# Patient Record
Sex: Female | Born: 1975 | Race: White | Hispanic: No | Marital: Married
Health system: Southern US, Community
[De-identification: ages and names within clinical notes are randomized; demographics above are authoritative.]

## PROBLEM LIST (undated history)

## (undated) DIAGNOSIS — G44229 Chronic tension-type headache, not intractable: Secondary | ICD-10-CM

## (undated) DIAGNOSIS — E782 Mixed hyperlipidemia: Secondary | ICD-10-CM

## (undated) DIAGNOSIS — N857 Hematometra: Secondary | ICD-10-CM

## (undated) DIAGNOSIS — Z8759 Personal history of other complications of pregnancy, childbirth and the puerperium: Secondary | ICD-10-CM

## (undated) DIAGNOSIS — G8929 Other chronic pain: Secondary | ICD-10-CM

## (undated) DIAGNOSIS — K219 Gastro-esophageal reflux disease without esophagitis: Secondary | ICD-10-CM

## (undated) DIAGNOSIS — Z789 Other specified health status: Secondary | ICD-10-CM

## (undated) DIAGNOSIS — Z803 Family history of malignant neoplasm of breast: Secondary | ICD-10-CM

## (undated) DIAGNOSIS — N200 Calculus of kidney: Secondary | ICD-10-CM

## (undated) DIAGNOSIS — Z87442 Personal history of urinary calculi: Secondary | ICD-10-CM

## (undated) DIAGNOSIS — F411 Generalized anxiety disorder: Secondary | ICD-10-CM

## (undated) DIAGNOSIS — Z8041 Family history of malignant neoplasm of ovary: Secondary | ICD-10-CM

## (undated) DIAGNOSIS — F410 Panic disorder [episodic paroxysmal anxiety] without agoraphobia: Secondary | ICD-10-CM

## (undated) HISTORY — PX: WISDOM TOOTH EXTRACTION: SHX21

## (undated) HISTORY — DX: Family history of malignant neoplasm of ovary: Z80.41

## (undated) HISTORY — DX: Family history of malignant neoplasm of breast: Z80.3

---
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
## 1998-06-03 HISTORY — PX: PILONIDAL CYST EXCISION: SHX744

## 1999-09-24 ENCOUNTER — Other Ambulatory Visit: Admission: RE | Admit: 1999-09-24 | Discharge: 1999-09-24 | Payer: Self-pay | Admitting: General Practice

## 2002-06-03 DIAGNOSIS — Z8639 Personal history of other endocrine, nutritional and metabolic disease: Secondary | ICD-10-CM

## 2002-06-03 HISTORY — DX: Personal history of other endocrine, nutritional and metabolic disease: Z86.39

## 2004-02-02 DIAGNOSIS — E890 Postprocedural hypothyroidism: Secondary | ICD-10-CM

## 2004-02-02 HISTORY — DX: Postprocedural hypothyroidism: E89.0

## 2004-02-15 HISTORY — PX: TOTAL THYROIDECTOMY: SHX2547

## 2004-06-03 HISTORY — PX: THYROID SURGERY: SHX805

## 2006-10-16 ENCOUNTER — Encounter: Admission: RE | Admit: 2006-10-16 | Discharge: 2006-10-16 | Payer: Self-pay | Admitting: Internal Medicine

## 2006-10-20 ENCOUNTER — Emergency Department (HOSPITAL_COMMUNITY): Admission: EM | Admit: 2006-10-20 | Discharge: 2006-10-20 | Payer: Self-pay | Admitting: Emergency Medicine

## 2007-02-16 ENCOUNTER — Encounter: Admission: RE | Admit: 2007-02-16 | Discharge: 2007-02-16 | Payer: Self-pay | Admitting: Otolaryngology

## 2007-03-02 ENCOUNTER — Encounter: Admission: RE | Admit: 2007-03-02 | Discharge: 2007-03-02 | Payer: Self-pay | Admitting: Obstetrics & Gynecology

## 2007-10-05 ENCOUNTER — Encounter (INDEPENDENT_AMBULATORY_CARE_PROVIDER_SITE_OTHER): Payer: Self-pay | Admitting: Diagnostic Radiology

## 2007-10-05 ENCOUNTER — Encounter: Admission: RE | Admit: 2007-10-05 | Discharge: 2007-10-05 | Payer: Self-pay | Admitting: Family Medicine

## 2009-02-08 IMAGING — RF DG ESOPHAGUS
10 of 12 series · 20 of 24 positions shown · non-contrast
Comparison: none

CLINICAL DATA: Dysphagia.
 BARIUM SWALLOW:

[Series 1: run · 4 of 7 slices shown (1 of 10)]
[im 1/7]
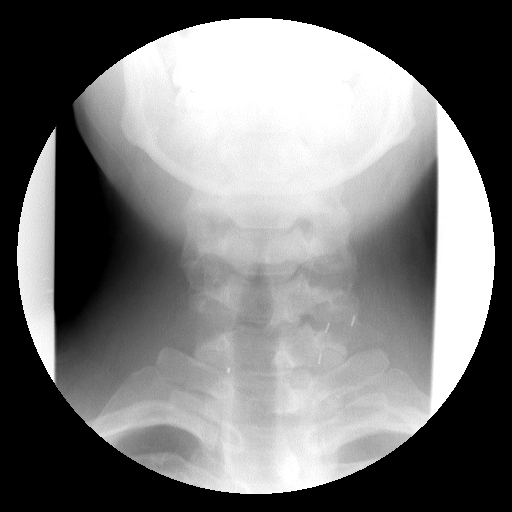
[im 2/7]
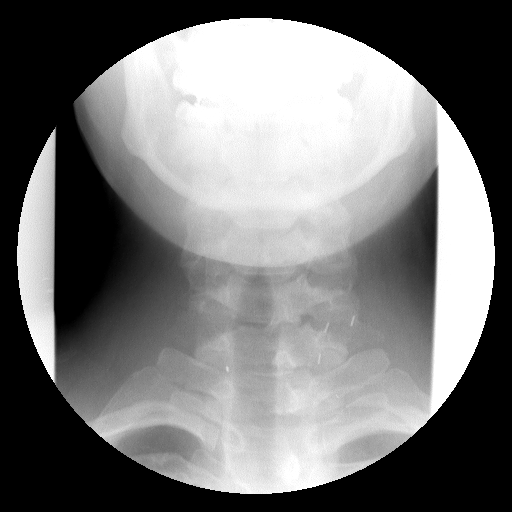
[im 5/7]
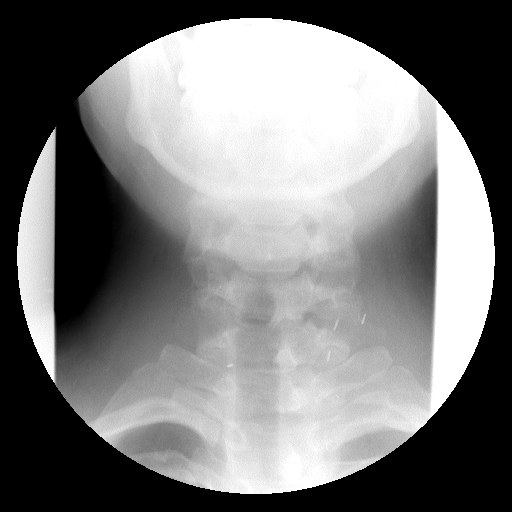
[im 7/7]
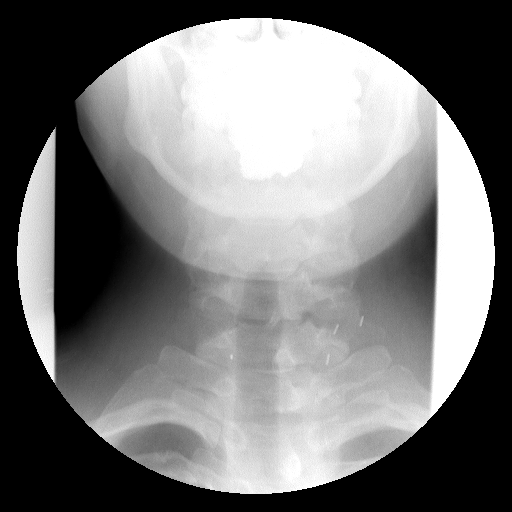

[Series 2: run · 3 of 3 slices shown (2 of 10)]
[im 1/3]
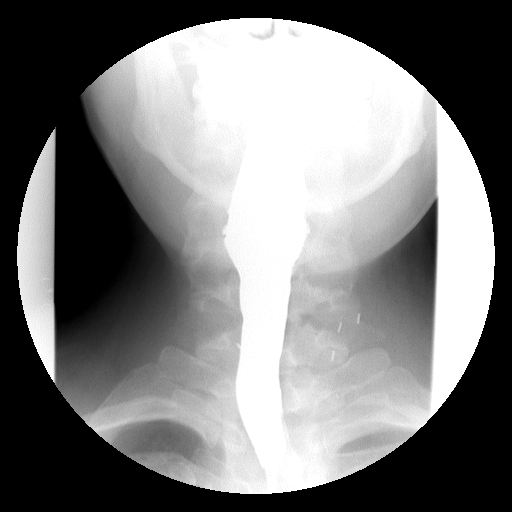
[im 2/3]
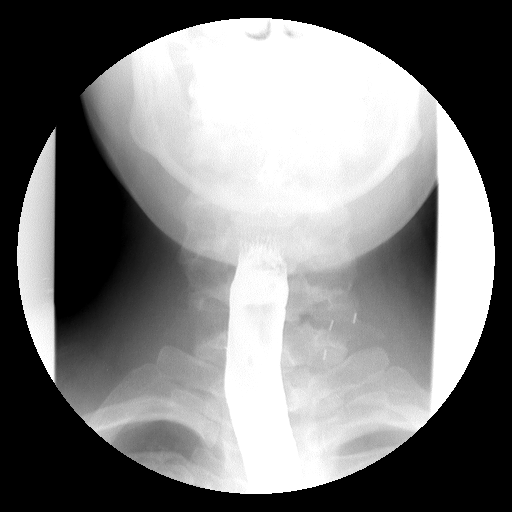
[im 3/3]
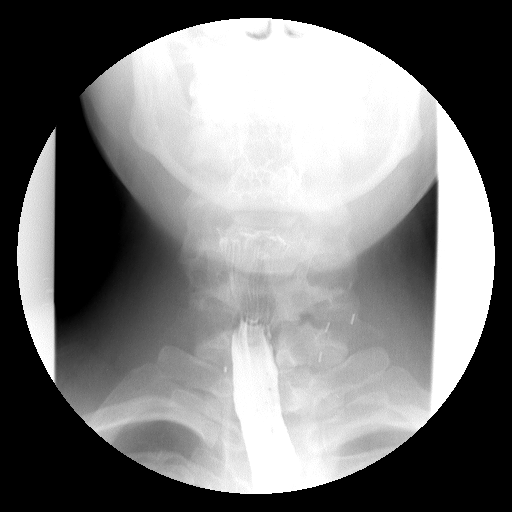

[Series 4: run · 3 of 4 slices shown (3 of 10)]
[im 1/4]
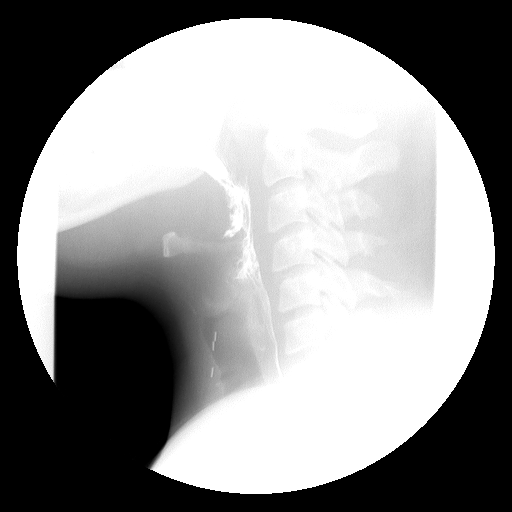
[im 2/4]
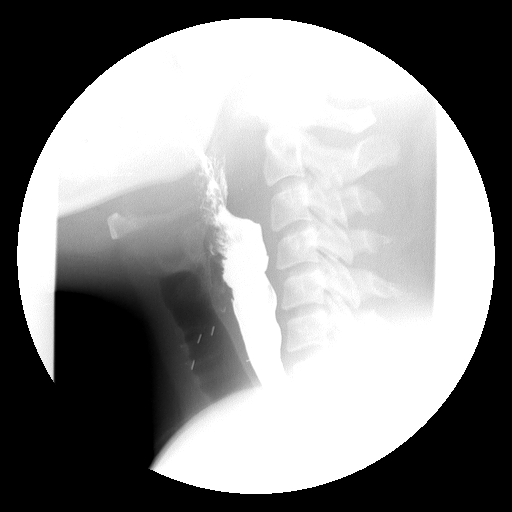
[im 4/4]
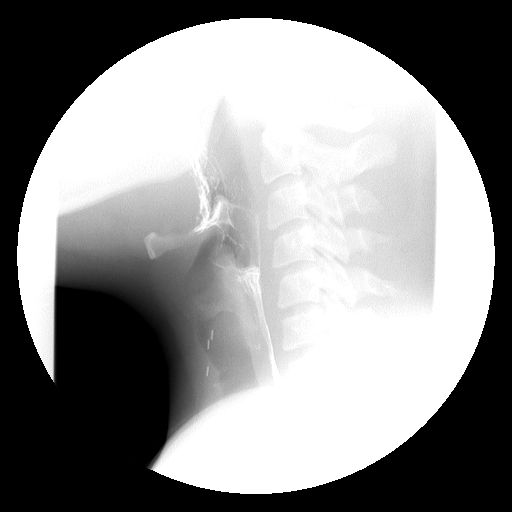

[Series 5: run · 1 of 1 slices shown (4 of 10)]
[im 1/1]
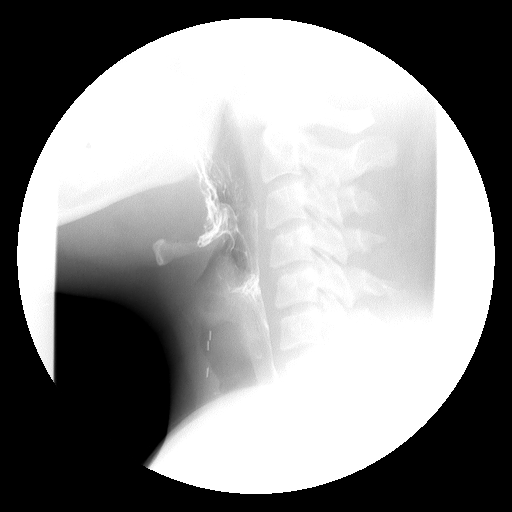

[Series 6: run · 4 of 6 slices shown (5 of 10)]
[im 1/6]
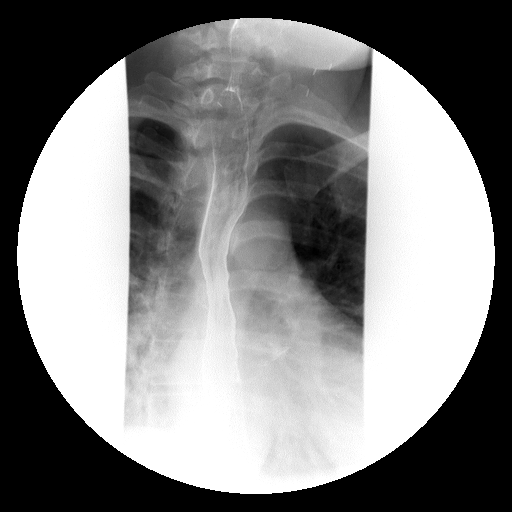
[im 3/6]
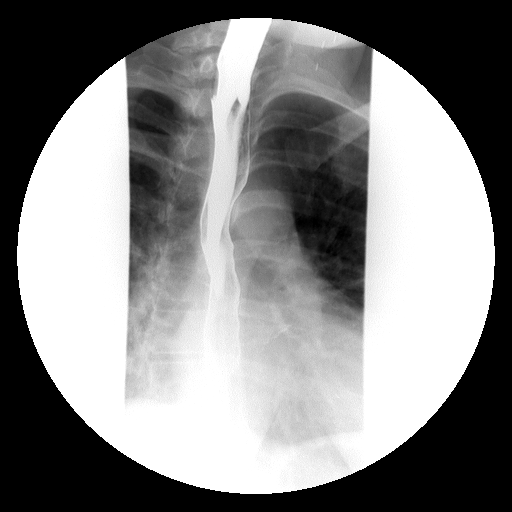
[im 4/6]
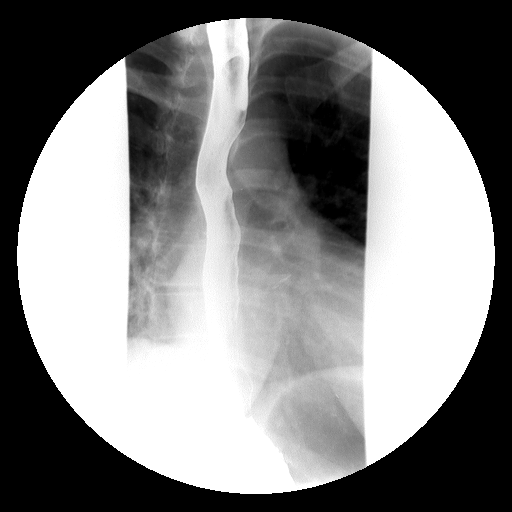
[im 6/6]
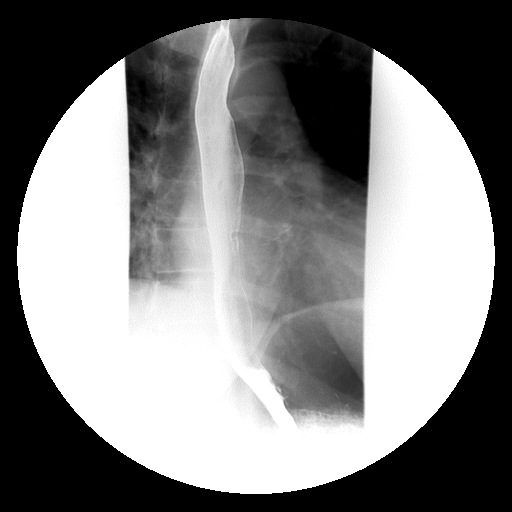

[Series 7: run · 1 of 1 slices shown (6 of 10)]
[im 1/1]
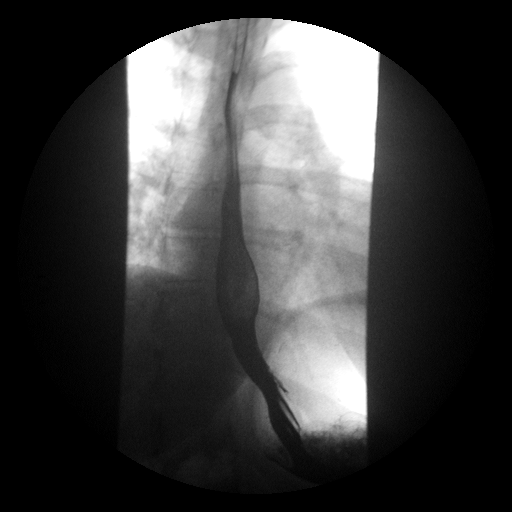

[Series 8: run · 1 of 1 slices shown (7 of 10)]
[im 1/1]
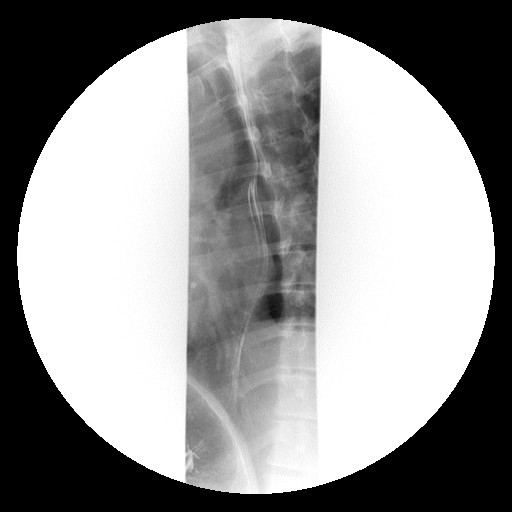

[Series 10: run · 1 of 1 slices shown (8 of 10)]
[im 1/1]
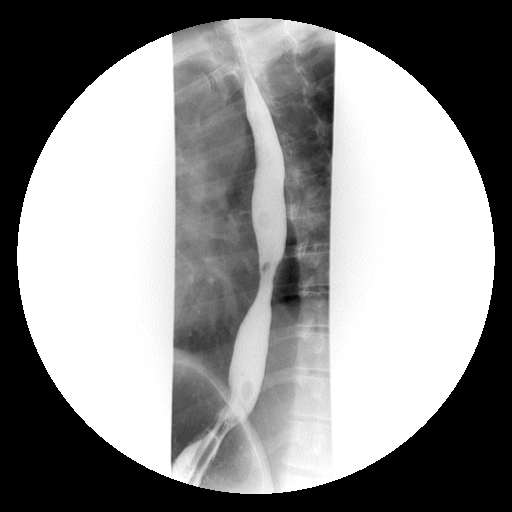

[Series 11: run · 1 of 1 slices shown (9 of 10)]
[im 1/1]
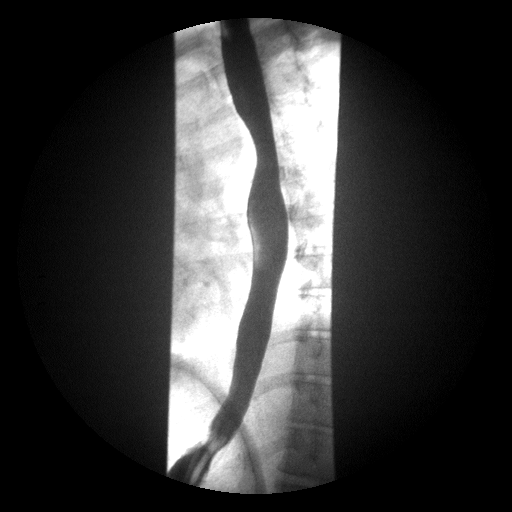

[Series 12: run · 1 of 1 slices shown (10 of 10)]
[im 1/1]
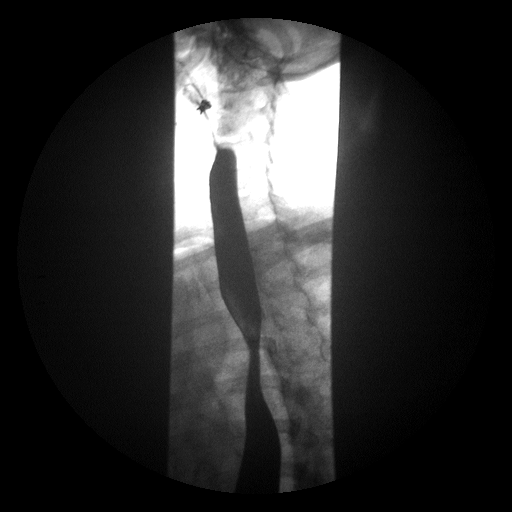

[20 of 24 positions shown; findings below may reference images not displayed]

FINDINGS: Swallowing mechanism unremarkable.  Esophageal motility normal.  No esophageal fold thickening, stricture or obstruction.  13 mm barium pill passed into the stomach without difficulty.
IMPRESSION: No acute findings.

## 2010-06-24 ENCOUNTER — Encounter: Payer: Self-pay | Admitting: Otolaryngology

## 2012-07-23 LAB — OB RESULTS CONSOLE HIV ANTIBODY (ROUTINE TESTING): HIV: NONREACTIVE

## 2012-07-23 LAB — OB RESULTS CONSOLE ABO/RH

## 2012-07-27 LAB — OB RESULTS CONSOLE GC/CHLAMYDIA: Gonorrhea: NEGATIVE

## 2012-12-30 LAB — OB RESULTS CONSOLE GBS: GBS: NEGATIVE

## 2013-01-19 ENCOUNTER — Encounter (HOSPITAL_COMMUNITY): Payer: Self-pay | Admitting: *Deleted

## 2013-01-19 ENCOUNTER — Inpatient Hospital Stay (HOSPITAL_COMMUNITY)
Admission: AD | Admit: 2013-01-19 | Discharge: 2013-01-23 | DRG: 765 | Disposition: A | Discharge status: Home / Self Care | Payer: Medicaid Other | Source: Ambulatory Visit | Attending: Obstetrics & Gynecology | Admitting: Obstetrics & Gynecology

## 2013-01-19 DIAGNOSIS — O139 Gestational [pregnancy-induced] hypertension without significant proteinuria, unspecified trimester: Principal | ICD-10-CM | POA: Diagnosis present

## 2013-01-19 DIAGNOSIS — Z98891 History of uterine scar from previous surgery: Secondary | ICD-10-CM

## 2013-01-19 DIAGNOSIS — O09529 Supervision of elderly multigravida, unspecified trimester: Secondary | ICD-10-CM | POA: Diagnosis present

## 2013-01-19 DIAGNOSIS — O411090 Infection of amniotic sac and membranes, unspecified, unspecified trimester, not applicable or unspecified: Secondary | ICD-10-CM | POA: Diagnosis present

## 2013-01-19 HISTORY — DX: Panic disorder (episodic paroxysmal anxiety): F41.0

## 2013-01-19 LAB — URINALYSIS, ROUTINE W REFLEX MICROSCOPIC
Bilirubin Urine: NEGATIVE
Ketones, ur: NEGATIVE mg/dL
Nitrite: NEGATIVE
Protein, ur: NEGATIVE mg/dL
Urobilinogen, UA: 0.2 mg/dL (ref 0.0–1.0)

## 2013-01-19 LAB — COMPREHENSIVE METABOLIC PANEL
AST: 19 U/L (ref 0–37)
Albumin: 2.8 g/dL — ABNORMAL LOW (ref 3.5–5.2)
CO2: 19 mEq/L (ref 19–32)
Calcium: 9.3 mg/dL (ref 8.4–10.5)
Creatinine, Ser: 0.65 mg/dL (ref 0.50–1.10)
GFR calc non Af Amer: >90 mL/min (ref >90)

## 2013-01-19 LAB — URINE MICROSCOPIC-ADD ON

## 2013-01-19 LAB — TYPE AND SCREEN: ABO/RH(D): A POS

## 2013-01-19 LAB — CBC
MCH: 31.5 pg (ref 26.0–34.0)
MCV: 92.4 fL (ref 78.0–100.0)
Platelets: 300 10*3/uL (ref 150–400)
RDW: 14.2 % (ref 11.5–15.5)

## 2013-01-19 MED ORDER — ZOLPIDEM TARTRATE 5 MG PO TABS
5.0000 mg | ORAL_TABLET | Freq: Every evening | ORAL | Status: DC | PRN
  Administered 2013-01-19: 5 mg via ORAL
  Filled 2013-01-19: qty 1

## 2013-01-19 MED ORDER — TERBUTALINE SULFATE 1 MG/ML IJ SOLN: 0.2500 mg | Freq: Once | INTRAMUSCULAR | Status: AC | PRN

## 2013-01-19 MED ORDER — OXYTOCIN 40 UNITS IN LACTATED RINGERS INFUSION - SIMPLE MED: 62.5000 mL/h | INTRAVENOUS | Status: DC

## 2013-01-19 MED ORDER — CITRIC ACID-SODIUM CITRATE 334-500 MG/5ML PO SOLN: 30.0000 mL | ORAL | Status: DC | PRN

## 2013-01-19 MED ORDER — ONDANSETRON HCL 4 MG/2ML IJ SOLN
4.0000 mg | Freq: Four times a day (QID) | INTRAMUSCULAR | Status: DC | PRN
  Administered 2013-01-21: 4 mg via INTRAVENOUS
  Filled 2013-01-19: qty 2

## 2013-01-19 MED ORDER — LIDOCAINE HCL (PF) 1 % IJ SOLN: 30.0000 mL | INTRAMUSCULAR | Status: DC | PRN

## 2013-01-19 MED ORDER — LACTATED RINGERS IV SOLN
INTRAVENOUS | Status: DC
  Administered 2013-01-19 – 2013-01-20 (×2): via INTRAVENOUS
  Administered 2013-01-20: 1000 mL via INTRAVENOUS
  Administered 2013-01-21 (×2): via INTRAVENOUS

## 2013-01-19 MED ORDER — ACETAMINOPHEN 325 MG PO TABS
650.0000 mg | ORAL_TABLET | ORAL | Status: DC | PRN
  Administered 2013-01-21: 650 mg via ORAL
  Filled 2013-01-19: qty 2

## 2013-01-19 MED ORDER — OXYCODONE-ACETAMINOPHEN 5-325 MG PO TABS: 1.0000 | ORAL_TABLET | ORAL | Status: DC | PRN

## 2013-01-19 MED ORDER — OXYTOCIN BOLUS FROM INFUSION: 500.0000 mL | INTRAVENOUS | Status: DC

## 2013-01-19 MED ORDER — LACTATED RINGERS IV SOLN
500.0000 mL | INTRAVENOUS | Status: DC | PRN
  Administered 2013-01-20: 500 mL via INTRAVENOUS

## 2013-01-19 MED ORDER — MISOPROSTOL 25 MCG QUARTER TABLET
25.0000 ug | ORAL_TABLET | ORAL | Status: DC | PRN
  Administered 2013-01-19 – 2013-01-20 (×3): 25 ug via VAGINAL
  Filled 2013-01-19 (×2): qty 0.25

## 2013-01-19 MED ORDER — FLEET ENEMA 7-19 GM/118ML RE ENEM: 1.0000 | ENEMA | RECTAL | Status: DC | PRN

## 2013-01-19 MED ORDER — IBUPROFEN 600 MG PO TABS: 600.0000 mg | ORAL_TABLET | Freq: Four times a day (QID) | ORAL | Status: DC | PRN

## 2013-01-19 NOTE — MAU Note (Signed)
Dr. Langston Masker notified of patient arrival to [facility]. Patient is to be an IOL for gestational hypertension. Orders received to start evaluation process in MAU for later admission to birthing suites when bed becoming available.

## 2013-01-19 NOTE — MAU Note (Addendum)
Patient states was sent to MAU from Dr. Langston Masker office for an induction. Denies LOF, bleeding, nor contractions. States has had blood pressure problems during this pregnancy. Last checked yesterday in office and was closed.

## 2013-01-20 ENCOUNTER — Inpatient Hospital Stay (HOSPITAL_COMMUNITY): Payer: Medicaid Other | Admitting: Anesthesiology

## 2013-01-20 ENCOUNTER — Encounter (HOSPITAL_COMMUNITY): Payer: Self-pay | Admitting: Anesthesiology

## 2013-01-20 LAB — CBC
Hemoglobin: 11.2 g/dL — ABNORMAL LOW (ref 12.0–15.0)
MCH: 31.5 pg (ref 26.0–34.0)
MCHC: 34 g/dL (ref 30.0–36.0)
MCV: 92.7 fL (ref 78.0–100.0)
RBC: 3.55 MIL/uL — ABNORMAL LOW (ref 3.87–5.11)

## 2013-01-20 MED ORDER — PHENYLEPHRINE 40 MCG/ML (10ML) SYRINGE FOR IV PUSH (FOR BLOOD PRESSURE SUPPORT)
80.0000 ug | PREFILLED_SYRINGE | INTRAVENOUS | Status: DC | PRN
Start: 2013-01-20 — End: 2013-01-21

## 2013-01-20 MED ORDER — DIPHENHYDRAMINE HCL 50 MG/ML IJ SOLN: 12.5000 mg | INTRAMUSCULAR | Status: DC | PRN

## 2013-01-20 MED ORDER — TERBUTALINE SULFATE 1 MG/ML IJ SOLN: 0.2500 mg | Freq: Once | INTRAMUSCULAR | Status: AC | PRN

## 2013-01-20 MED ORDER — EPHEDRINE 5 MG/ML INJ: 10.0000 mg | INTRAVENOUS | Status: DC | PRN

## 2013-01-20 MED ORDER — LACTATED RINGERS IV SOLN: 500.0000 mL | Freq: Once | INTRAVENOUS | Status: DC

## 2013-01-20 MED ORDER — BUTORPHANOL TARTRATE 1 MG/ML IJ SOLN
1.0000 mg | Freq: Once | INTRAMUSCULAR | Status: AC
  Administered 2013-01-20: 1 mg via INTRAVENOUS

## 2013-01-20 MED ORDER — SODIUM BICARBONATE 8.4 % IV SOLN
INTRAVENOUS | Status: DC | PRN
  Administered 2013-01-20: 5 mL via EPIDURAL
  Administered 2013-01-21: 3 mL via EPIDURAL
  Administered 2013-01-21 (×2): 5 mL via EPIDURAL
  Administered 2013-01-21: 7 mL via EPIDURAL

## 2013-01-20 MED ORDER — OXYTOCIN 40 UNITS IN LACTATED RINGERS INFUSION - SIMPLE MED
1.0000 m[IU]/min | INTRAVENOUS | Status: DC
  Administered 2013-01-20: 4 m[IU]/min via INTRAVENOUS
  Administered 2013-01-20: 2 m[IU]/min via INTRAVENOUS
  Administered 2013-01-20: 10 m[IU]/min via INTRAVENOUS
  Administered 2013-01-20: 8 m[IU]/min via INTRAVENOUS
  Administered 2013-01-20: 6 m[IU]/min via INTRAVENOUS
  Filled 2013-01-20: qty 1000

## 2013-01-20 MED ORDER — FENTANYL 2.5 MCG/ML BUPIVACAINE 1/10 % EPIDURAL INFUSION (WH - ANES)
14.0000 mL/h | INTRAMUSCULAR | Status: DC | PRN
  Administered 2013-01-20 – 2013-01-21 (×3): 14 mL/h via EPIDURAL
  Filled 2013-01-20 (×4): qty 125

## 2013-01-20 MED ORDER — EPHEDRINE 5 MG/ML INJ
10.0000 mg | INTRAVENOUS | Status: DC | PRN
  Filled 2013-01-20: qty 4

## 2013-01-20 MED ORDER — PHENYLEPHRINE 40 MCG/ML (10ML) SYRINGE FOR IV PUSH (FOR BLOOD PRESSURE SUPPORT)
80.0000 ug | PREFILLED_SYRINGE | INTRAVENOUS | Status: DC | PRN
  Filled 2013-01-20: qty 5

## 2013-01-20 MED ORDER — BUTORPHANOL TARTRATE 1 MG/ML IJ SOLN
1.0000 mg | INTRAMUSCULAR | Status: DC | PRN
  Administered 2013-01-20: 1 mg via INTRAVENOUS
  Filled 2013-01-20 (×2): qty 1

## 2013-01-20 NOTE — Anesthesia Procedure Notes (Addendum)

## 2013-01-20 NOTE — Anesthesia Preprocedure Evaluation (Addendum)
Anesthesia Evaluation  Patient identified by MRN, date of birth, ID band Patient awake    Reviewed: Allergy & Precautions, H&P , Patient's Chart, lab work & pertinent test results  Airway Mallampati: II  TM Distance: >3 FB Neck ROM: full    Dental  (+) Teeth Intact   Pulmonary    breath sounds clear to auscultation       Cardiovascular hypertension,  Rhythm:regular Rate:Normal     Neuro/Psych    GI/Hepatic   Endo/Other  Morbid obesity  Renal/GU      Musculoskeletal   Abdominal   Peds  Hematology   Anesthesia Other Findings       Reproductive/Obstetrics (+) Pregnancy                             Anesthesia Physical Anesthesia Plan  ASA: III  Anesthesia Plan: Epidural   Post-op Pain Management:    Induction:   Airway Management Planned:   Additional Equipment:   Intra-op Plan:   Post-operative Plan:   Informed Consent: I have reviewed the patients History and Physical, chart, labs and discussed the procedure including the risks, benefits and alternatives for the proposed anesthesia with the patient or authorized representative who has indicated his/her understanding and acceptance.   Dental Advisory Given  Plan Discussed with:   Anesthesia Plan Comments: (Labs checked- platelets confirmed with RN in room. Fetal heart tracing, per RN, reported to be stable enough for sitting procedure. Discussed epidural, and patient consents to the procedure:  included risk of possible headache,backache, failed block, allergic reaction, and nerve injury. This patient was asked if she had any questions or concerns before the procedure started.)        Anesthesia Quick Evaluation  

## 2013-01-20 NOTE — Progress Notes (Signed)
Pt feeling mild ctx.  No HA, N/v or visual changes.  No vb or lof.  Good fm  FHT reassuring Toco Q1-2 Cvx softer 1/50/-2, ballotable  Will re-examine in a few hours and attempt foley bulb Exp mngt Continue pitocin

## 2013-01-20 NOTE — Progress Notes (Signed)
Pt feeling mild contractions.  cytotec x 3 given o/n.  FHT reassuring Toco Q3-4 Cvx tight FT  A/P:  Too many ctx to continue cytotec.  Will start low dose pitocin If Cvx more dilated at next exam, may try foley bulb.  (h/o cryo)

## 2013-01-20 NOTE — H&P (Signed)
Terri Lopez is a 37 y.o. female presenting for IOL.  Pt with gestational HTN - BP in office 140-150/90s.  No sx of pre-eclampsia. History OB History   Grav Para Term Preterm Abortions TAB SAB Ect Mult Living   2    1 1          Past Medical History  Diagnosis Date  . Panic attacks    Past Surgical History  Procedure Laterality Date  . Thyroid surgery  2006   Family History: family history includes Diabetes in her mother. Social History:  reports that she quit smoking about 5 months ago. Her smoking use included Cigarettes. She smoked 1.00 pack per day. She has never used smokeless tobacco. She reports that she does not drink alcohol or use illicit drugs.   Prenatal Transfer Tool  Maternal Diabetes: No Genetic Screening: Normal Maternal Ultrasounds/Referrals: Normal Fetal Ultrasounds or other Referrals:  None Maternal Substance Abuse:  No Significant Maternal Medications:  Meds include: Zoloft Syntroid Significant Maternal Lab Results:  None Other Comments:  None  ROS  Dilation: Fingertip Effacement (%): 50 Station: -2 Exam by:: Jorgen Wolfinger Blood pressure 149/87, pulse 79, temperature 98.5 F (36.9 C), temperature source Oral, resp. rate 20, height 5\' 2"  (1.575 m), weight 106.142 kg (234 lb), SpO2 99.00%. Exam Physical Exam  Gen - NAD Abd - gravid, NT.  EFW 8 1/2# Ext - NT, trace edema bilat Cvx - FT Prenatal labs: ABO, Rh: --/--/A POS (08/19 2130) Antibody: NEG (08/19 2130) Rubella: Immune (02/20 0000) RPR: NON REACTIVE (08/19 1920)  HBsAg: Negative (02/20 0000)  HIV: Non-reactive (02/20 0000)  GBS: Negative (07/30 0000)   Assessment/Plan: Admit IOL   Jonah Gingras 01/20/2013, 9:14 AM

## 2013-01-20 NOTE — Progress Notes (Signed)
Pt reports occ discomfort.  No regular contractions.  No vb or lof  FHT reassuring Toco Q1-2 Cvx 1/50/-2 Foley bulb inserted  A/P:  Continue pitocin Exp mngt Epidural/IV pain meds prn

## 2013-01-21 ENCOUNTER — Encounter (HOSPITAL_COMMUNITY): Payer: Self-pay | Admitting: *Deleted

## 2013-01-21 ENCOUNTER — Encounter (HOSPITAL_COMMUNITY)
Admission: AD | Disposition: A | Discharge status: Home / Self Care | Payer: Self-pay | Source: Ambulatory Visit | Attending: Obstetrics & Gynecology

## 2013-01-21 LAB — CBC
HCT: 31.4 % — ABNORMAL LOW (ref 36.0–46.0)
MCHC: 34.4 g/dL (ref 30.0–36.0)
MCV: 92.6 fL (ref 78.0–100.0)
RDW: 14.4 % (ref 11.5–15.5)

## 2013-01-21 SURGERY — Surgical Case
Anesthesia: Epidural | Site: Abdomen | Wound class: Clean Contaminated

## 2013-01-21 MED ORDER — LACTATED RINGERS IV SOLN
INTRAVENOUS | Status: DC | PRN
  Administered 2013-01-21 (×3): via INTRAVENOUS

## 2013-01-21 MED ORDER — DIBUCAINE 1 % RE OINT: 1.0000 "application " | TOPICAL_OINTMENT | RECTAL | Status: DC | PRN

## 2013-01-21 MED ORDER — LIDOCAINE-EPINEPHRINE (PF) 2 %-1:200000 IJ SOLN
INTRAMUSCULAR | Status: AC
  Filled 2013-01-21: qty 20

## 2013-01-21 MED ORDER — KETOROLAC TROMETHAMINE 30 MG/ML IJ SOLN: 30.0000 mg | Freq: Four times a day (QID) | INTRAMUSCULAR | Status: AC | PRN

## 2013-01-21 MED ORDER — OXYTOCIN 40 UNITS IN LACTATED RINGERS INFUSION - SIMPLE MED: 62.5000 mL/h | INTRAVENOUS | Status: AC

## 2013-01-21 MED ORDER — MENTHOL 3 MG MT LOZG: 1.0000 | LOZENGE | OROMUCOSAL | Status: DC | PRN

## 2013-01-21 MED ORDER — DIPHENHYDRAMINE HCL 25 MG PO CAPS
25.0000 mg | ORAL_CAPSULE | ORAL | Status: DC | PRN
  Filled 2013-01-21: qty 1

## 2013-01-21 MED ORDER — DEXTROSE 5 % IV SOLN
1.0000 g | Freq: Two times a day (BID) | INTRAVENOUS | Status: AC
  Administered 2013-01-22 – 2013-01-23 (×3): 1 g via INTRAVENOUS
  Filled 2013-01-21 (×3): qty 1

## 2013-01-21 MED ORDER — METOCLOPRAMIDE HCL 5 MG/ML IJ SOLN: 10.0000 mg | Freq: Three times a day (TID) | INTRAMUSCULAR | Status: DC | PRN

## 2013-01-21 MED ORDER — DIPHENHYDRAMINE HCL 50 MG/ML IJ SOLN: 12.5000 mg | INTRAMUSCULAR | Status: DC | PRN

## 2013-01-21 MED ORDER — DEXTROSE 5 % IV SOLN
2.0000 g | INTRAVENOUS | Status: DC
  Filled 2013-01-21: qty 2

## 2013-01-21 MED ORDER — CITRIC ACID-SODIUM CITRATE 334-500 MG/5ML PO SOLN
ORAL | Status: AC
  Administered 2013-01-21: 30 mL
  Filled 2013-01-21: qty 15

## 2013-01-21 MED ORDER — ONDANSETRON HCL 4 MG PO TABS: 4.0000 mg | ORAL_TABLET | ORAL | Status: DC | PRN

## 2013-01-21 MED ORDER — LACTATED RINGERS IV SOLN
INTRAVENOUS | Status: DC | PRN
  Administered 2013-01-21: 19:00:00 via INTRAVENOUS

## 2013-01-21 MED ORDER — NALBUPHINE HCL 10 MG/ML IJ SOLN: 5.0000 mg | INTRAMUSCULAR | Status: DC | PRN

## 2013-01-21 MED ORDER — ONDANSETRON HCL 4 MG/2ML IJ SOLN
INTRAMUSCULAR | Status: DC | PRN
  Administered 2013-01-21: 4 mg via INTRAVENOUS

## 2013-01-21 MED ORDER — EPHEDRINE 5 MG/ML INJ
INTRAVENOUS | Status: AC
  Filled 2013-01-21: qty 10

## 2013-01-21 MED ORDER — PROMETHAZINE HCL 25 MG/ML IJ SOLN: 6.2500 mg | INTRAMUSCULAR | Status: DC | PRN

## 2013-01-21 MED ORDER — SENNOSIDES-DOCUSATE SODIUM 8.6-50 MG PO TABS
2.0000 | ORAL_TABLET | Freq: Every day | ORAL | Status: DC
  Administered 2013-01-22: 2 via ORAL

## 2013-01-21 MED ORDER — SCOPOLAMINE 1 MG/3DAYS TD PT72
1.0000 | MEDICATED_PATCH | Freq: Once | TRANSDERMAL | Status: DC
  Administered 2013-01-21: 1.5 mg via TRANSDERMAL

## 2013-01-21 MED ORDER — NALOXONE HCL 1 MG/ML IJ SOLN: 1.0000 ug/kg/h | INTRAMUSCULAR | Status: DC | PRN

## 2013-01-21 MED ORDER — KETOROLAC TROMETHAMINE 60 MG/2ML IM SOLN
INTRAMUSCULAR | Status: AC
  Filled 2013-01-21: qty 2

## 2013-01-21 MED ORDER — MEPERIDINE HCL 25 MG/ML IJ SOLN: 6.2500 mg | INTRAMUSCULAR | Status: DC | PRN

## 2013-01-21 MED ORDER — SODIUM BICARBONATE 8.4 % IV SOLN
INTRAVENOUS | Status: AC
  Filled 2013-01-21: qty 50

## 2013-01-21 MED ORDER — ONDANSETRON HCL 4 MG/2ML IJ SOLN: 4.0000 mg | Freq: Three times a day (TID) | INTRAMUSCULAR | Status: DC | PRN

## 2013-01-21 MED ORDER — DIPHENHYDRAMINE HCL 50 MG/ML IJ SOLN: 25.0000 mg | INTRAMUSCULAR | Status: DC | PRN

## 2013-01-21 MED ORDER — KETOROLAC TROMETHAMINE 30 MG/ML IJ SOLN: 15.0000 mg | Freq: Once | INTRAMUSCULAR | Status: DC | PRN

## 2013-01-21 MED ORDER — MORPHINE SULFATE (PF) 0.5 MG/ML IJ SOLN
INTRAMUSCULAR | Status: DC | PRN
  Administered 2013-01-21: 1 mg via INTRAVENOUS
  Administered 2013-01-21: 4 mg via EPIDURAL

## 2013-01-21 MED ORDER — LACTATED RINGERS IV SOLN
INTRAVENOUS | Status: DC
  Administered 2013-01-21 – 2013-01-22 (×2): via INTRAVENOUS

## 2013-01-21 MED ORDER — KETOROLAC TROMETHAMINE 60 MG/2ML IM SOLN
60.0000 mg | Freq: Once | INTRAMUSCULAR | Status: AC | PRN
  Administered 2013-01-21: 60 mg via INTRAMUSCULAR

## 2013-01-21 MED ORDER — WITCH HAZEL-GLYCERIN EX PADS
1.0000 "application " | MEDICATED_PAD | CUTANEOUS | Status: DC | PRN
  Administered 2013-01-22: 1 via TOPICAL

## 2013-01-21 MED ORDER — SIMETHICONE 80 MG PO CHEW
80.0000 mg | CHEWABLE_TABLET | Freq: Three times a day (TID) | ORAL | Status: DC
  Administered 2013-01-22 – 2013-01-23 (×6): 80 mg via ORAL

## 2013-01-21 MED ORDER — NALOXONE HCL 0.4 MG/ML IJ SOLN: 0.4000 mg | INTRAMUSCULAR | Status: DC | PRN

## 2013-01-21 MED ORDER — ZOLPIDEM TARTRATE 5 MG PO TABS: 5.0000 mg | ORAL_TABLET | Freq: Every evening | ORAL | Status: DC | PRN

## 2013-01-21 MED ORDER — GENTAMICIN SULFATE 40 MG/ML IJ SOLN
150.0000 mg | Freq: Three times a day (TID) | INTRAVENOUS | Status: DC
  Administered 2013-01-21: 150 mg via INTRAVENOUS
  Filled 2013-01-21 (×2): qty 3.75

## 2013-01-21 MED ORDER — DEXTROSE 5 % IV SOLN
170.0000 mg | Freq: Once | INTRAVENOUS | Status: AC
  Administered 2013-01-21: 170 mg via INTRAVENOUS
  Filled 2013-01-21: qty 4.25

## 2013-01-21 MED ORDER — IBUPROFEN 600 MG PO TABS
600.0000 mg | ORAL_TABLET | Freq: Four times a day (QID) | ORAL | Status: DC
  Administered 2013-01-22 – 2013-01-23 (×6): 600 mg via ORAL
  Filled 2013-01-21 (×6): qty 1

## 2013-01-21 MED ORDER — OXYCODONE-ACETAMINOPHEN 5-325 MG PO TABS
1.0000 | ORAL_TABLET | ORAL | Status: DC | PRN
  Administered 2013-01-22: 2 via ORAL
  Administered 2013-01-22 – 2013-01-23 (×5): 1 via ORAL
  Administered 2013-01-23: 2 via ORAL
  Administered 2013-01-23: 1 via ORAL
  Filled 2013-01-21: qty 1
  Filled 2013-01-21: qty 2
  Filled 2013-01-21 (×4): qty 1
  Filled 2013-01-21: qty 2
  Filled 2013-01-21: qty 1

## 2013-01-21 MED ORDER — LANOLIN HYDROUS EX OINT: 1.0000 "application " | TOPICAL_OINTMENT | CUTANEOUS | Status: DC | PRN

## 2013-01-21 MED ORDER — GLYCOPYRROLATE 0.2 MG/ML IJ SOLN
INTRAMUSCULAR | Status: DC | PRN
  Administered 2013-01-21: 0.1 mg via INTRAVENOUS

## 2013-01-21 MED ORDER — SODIUM CHLORIDE 0.9 % IV SOLN
2.0000 g | Freq: Once | INTRAVENOUS | Status: AC
  Administered 2013-01-21: 2 g via INTRAVENOUS
  Filled 2013-01-21: qty 2000

## 2013-01-21 MED ORDER — MORPHINE SULFATE 0.5 MG/ML IJ SOLN
INTRAMUSCULAR | Status: AC
  Filled 2013-01-21: qty 10

## 2013-01-21 MED ORDER — SODIUM CHLORIDE 0.9 % IJ SOLN: 3.0000 mL | INTRAMUSCULAR | Status: DC | PRN

## 2013-01-21 MED ORDER — HYDROMORPHONE HCL PF 1 MG/ML IJ SOLN
INTRAMUSCULAR | Status: AC
  Filled 2013-01-21: qty 1

## 2013-01-21 MED ORDER — SIMETHICONE 80 MG PO CHEW: 80.0000 mg | CHEWABLE_TABLET | ORAL | Status: DC | PRN

## 2013-01-21 MED ORDER — SODIUM CHLORIDE 0.9 % IV SOLN
1.0000 g | INTRAVENOUS | Status: DC
  Administered 2013-01-21 (×2): 1 g via INTRAVENOUS
  Filled 2013-01-21 (×6): qty 1000

## 2013-01-21 MED ORDER — PHENYLEPHRINE 40 MCG/ML (10ML) SYRINGE FOR IV PUSH (FOR BLOOD PRESSURE SUPPORT)
PREFILLED_SYRINGE | INTRAVENOUS | Status: AC
  Filled 2013-01-21: qty 15

## 2013-01-21 MED ORDER — PHENYLEPHRINE HCL 10 MG/ML IJ SOLN
INTRAMUSCULAR | Status: DC | PRN
  Administered 2013-01-21: 80 ug via INTRAVENOUS

## 2013-01-21 MED ORDER — TETANUS-DIPHTH-ACELL PERTUSSIS 5-2.5-18.5 LF-MCG/0.5 IM SUSP: 0.5000 mL | Freq: Once | INTRAMUSCULAR | Status: DC

## 2013-01-21 MED ORDER — CALCIUM CARBONATE ANTACID 500 MG PO CHEW: 400.0000 mg | CHEWABLE_TABLET | Freq: Three times a day (TID) | ORAL | Status: DC | PRN

## 2013-01-21 MED ORDER — OXYTOCIN 10 UNIT/ML IJ SOLN
INTRAMUSCULAR | Status: AC
  Filled 2013-01-21: qty 4

## 2013-01-21 MED ORDER — LEVOTHYROXINE SODIUM 100 MCG PO TABS
200.0000 ug | ORAL_TABLET | Freq: Every day | ORAL | Status: DC
  Administered 2013-01-22 – 2013-01-23 (×2): 200 ug via ORAL
  Filled 2013-01-21 (×3): qty 2

## 2013-01-21 MED ORDER — DEXTROSE 5 % IV SOLN
2.0000 g | INTRAVENOUS | Status: DC | PRN
  Administered 2013-01-21: 2 g via INTRAVENOUS

## 2013-01-21 MED ORDER — SCOPOLAMINE 1 MG/3DAYS TD PT72
MEDICATED_PATCH | TRANSDERMAL | Status: AC
  Filled 2013-01-21: qty 1

## 2013-01-21 MED ORDER — HYDROMORPHONE HCL PF 1 MG/ML IJ SOLN
0.2500 mg | INTRAMUSCULAR | Status: DC | PRN
  Administered 2013-01-21 (×2): 0.5 mg via INTRAVENOUS

## 2013-01-21 MED ORDER — ONDANSETRON HCL 4 MG/2ML IJ SOLN
4.0000 mg | INTRAMUSCULAR | Status: DC | PRN
  Administered 2013-01-22: 4 mg via INTRAVENOUS
  Filled 2013-01-21: qty 2

## 2013-01-21 MED ORDER — OXYTOCIN 10 UNIT/ML IJ SOLN
40.0000 [IU] | INTRAMUSCULAR | Status: DC | PRN
  Administered 2013-01-21: 40 [IU] via INTRAVENOUS

## 2013-01-21 MED ORDER — ONDANSETRON HCL 4 MG/2ML IJ SOLN
INTRAMUSCULAR | Status: AC
  Filled 2013-01-21: qty 2

## 2013-01-21 MED ORDER — PRENATAL MULTIVITAMIN CH
1.0000 | ORAL_TABLET | Freq: Every day | ORAL | Status: DC
  Administered 2013-01-22 – 2013-01-23 (×2): 1 via ORAL
  Filled 2013-01-21 (×2): qty 1

## 2013-01-21 SURGICAL SUPPLY — 26 items
CLAMP CORD UMBIL (MISCELLANEOUS) IMPLANT
CLOTH BEACON ORANGE TIMEOUT ST (SAFETY) ×2 IMPLANT
DRAPE LG THREE QUARTER DISP (DRAPES) ×2 IMPLANT
DRSG OPSITE POSTOP 4X10 (GAUZE/BANDAGES/DRESSINGS) ×2 IMPLANT
DURAPREP 26ML APPLICATOR (WOUND CARE) ×2 IMPLANT
ELECT REM PT RETURN 9FT ADLT (ELECTROSURGICAL) ×2
ELECTRODE REM PT RTRN 9FT ADLT (ELECTROSURGICAL) ×1 IMPLANT
EXTRACTOR VACUUM M CUP 4 TUBE (SUCTIONS) IMPLANT
GLOVE SURG ORTHO 8.0 STRL STRW (GLOVE) ×2 IMPLANT
GOWN STRL REIN XL XLG (GOWN DISPOSABLE) ×4 IMPLANT
KIT ABG SYR 3ML LUER SLIP (SYRINGE) ×2 IMPLANT
NDL HYPO 25X5/8 SAFETYGLIDE (NEEDLE) ×1 IMPLANT
NEEDLE HYPO 25X5/8 SAFETYGLIDE (NEEDLE) ×2 IMPLANT
NS IRRIG 1000ML POUR BTL (IV SOLUTION) ×2 IMPLANT
PACK C SECTION WH (CUSTOM PROCEDURE TRAY) ×2 IMPLANT
PAD OB MATERNITY 4.3X12.25 (PERSONAL CARE ITEMS) ×2 IMPLANT
STAPLER VISISTAT 35W (STAPLE) IMPLANT
SUT MNCRL 0 VIOLET CTX 36 (SUTURE) ×3 IMPLANT
SUT MONOCRYL 0 CTX 36 (SUTURE) ×4
SUT PDS AB 1 CT  36 (SUTURE)
SUT PDS AB 1 CT 36 (SUTURE) IMPLANT
SUT VIC AB 1 CTX 36 (SUTURE)
SUT VIC AB 1 CTX36XBRD ANBCTRL (SUTURE) IMPLANT
TOWEL OR 17X24 6PK STRL BLUE (TOWEL DISPOSABLE) ×2 IMPLANT
TRAY FOLEY CATH 14FR (SET/KITS/TRAYS/PACK) ×2 IMPLANT
WATER STERILE IRR 1000ML POUR (IV SOLUTION) ×2 IMPLANT

## 2013-01-21 NOTE — Anesthesia Postprocedure Evaluation (Signed)
  Anesthesia Post-op Note  Patient: Terri Lopez  Procedure(s) Performed: Procedure(s): CESAREAN SECTION (N/A)  Patient is awake, responsive, moving her legs, and has signs of resolution of her numbness. Pain and nausea are reasonably well controlled. Vital signs are stable and clinically acceptable. Oxygen saturation is clinically acceptable. There are no apparent anesthetic complications at this time. Patient is ready for discharge.

## 2013-01-21 NOTE — Progress Notes (Signed)
Patient ID: Terri Lopez, female   DOB: [birth year], 37 y.o.   MRN: [id number] PT comfortable with epidural No pih sxs BP 140s/80s Cx 3/90/-2 IUPC placed  Continue induction IUPC placed FHR cat 1 now ? chorio (ROM 2000 8/20) Cont amp/gent DL

## 2013-01-21 NOTE — Transfer of Care (Signed)
Immediate Anesthesia Transfer of Care Note  Patient: Terri Lopez  Procedure(s) Performed: Procedure(s): CESAREAN SECTION (N/A)  Patient Location: PACU  Anesthesia Type:Spinal  Level of Consciousness: awake, alert  and oriented  Airway & Oxygen Therapy: Patient Spontanous Breathing  Post-op Assessment: Report given to PACU RN  Post vital signs: Reviewed and stable  Complications: No apparent anesthesia complications

## 2013-01-21 NOTE — Progress Notes (Signed)
Patient ID: Terri Lopez, female   DOB: January 16, 1976, 37 y.o.   MRN: [id number] Pt comfortable with epidural No pih sxs VSS BPs stable but elevated  FHR reactive Cat 1 Ctxs >MVU over last 4 hours CX 6 cm for last 4 hours with increasing caput and now with cervical edema  Arrest of dilation Chorio Gest HTN Plan Primary LSCTS Risks and benefits of C/S were discussed.  All questions were answered and informed consent was obtained.  Plan to proceed with low segment transverse Cesarean Section. DL

## 2013-01-21 NOTE — Op Note (Signed)
Cesarean Section Procedure Note  Pre-operative Diagnosis: IUp at 39 4/7, Gestational HTN , Chorioamninitis, Arrest of dilation  Post-operative Diagnosis: same+OP presentation  Surgeon: Aleta Manternach C   Assistants: none  Anesthesia:epdiural  Procedure:  Low Segment Transverse cesarean section  Procedure Details  The patient was seen in the Holding Room. The risks, benefits, complications, treatment options, and expected outcomes were discussed with the patient.  The patient concurred with the proposed plan, giving informed consent.  The site of surgery properly noted/marked.. A Time Out was held and the above information confirmed.  After induction of anesthesia, the patient was draped and prepped in the usual sterile manner. A Pfannenstiel incision was made and carried down through the subcutaneous tissue to the fascia. Fascial incision was made and extended transversely. The fascia was separated from the underlying rectus tissue superiorly and inferiorly. The peritoneum was identified and entered. Peritoneal incision was extended longitudinally. The utero-vesical peritoneal reflection was incised transversely and the bladder flap was bluntly freed from the lower uterine segment. A low transverse uterine incision was made. Delivered from OP presentation was a baby with Apgar scores of 9 at one minute and 9 at five minutes. After the umbilical cord was clamped and cut cord blood was obtained for evaluation. The placenta was removed intact and appeared normal. The uterine outline, tubes and ovaries appeared normal. The uterine incision was closed with running locked sutures of 0 monocryl and imbricated with 0 monocryl. Hemostasis was observed. Lavage was carried out until clear. The peritoneum was then closed with 0 monocryl and rectus muscles plicated in the midline.  After hemostasis was assured, the fascia was then reapproximated with running sutures of 1 PDS. Irrigation was applied and after  adequate hemostasis was assured, the skin was reapproximated with staples.  Instrument, sponge, and needle counts were correct prior the abdominal closure and at the conclusion of the case. The patient received 2 grams cefotetan preoperatively.  Findings: Viable female, Ph art sent  Estimated Blood Loss:  700cc         Specimens: Placenta was sent to Pathology         Complications:  None

## 2013-01-21 NOTE — Progress Notes (Signed)
ANTIBIOTIC CONSULT NOTE - INITIAL  Pharmacy Consult for gentamicin Indication: maternal fever  No Known Allergies  Patient Measurements: Height: 5\' 2"  (157.5 cm) Weight: 234 lb (106.142 kg) IBW/kg (Calculated) : 50.1 Adjusted Body Weight: 67 kg  Vital Signs: Temp: 100.6 F (38.1 C) (08/21 0457) Temp src: Axillary (08/21 0457) BP: 135/78 mmHg (08/21 0600) Pulse Rate: 95 (08/21 0600) Intake/Output from previous day:   Intake/Output from this shift:    Labs:  Recent Labs  01/19/13 1920 01/20/13 2020  WBC 10.6* 12.8*  HGB 11.2* 11.2*  PLT 300 292  CREATININE 0.65  --    Est CrCl = 95 -105 ml/min  Microbiology: Recent Results (from the past 720 hour(s))  OB RESULTS CONSOLE GBS     Status: None   Collection Time    12/30/12 12:00 AM      Result Value Range Status   GBS Negative   Final    Medical History: Past Medical History  Diagnosis Date  . Panic attacks     Medications:  Scheduled:  . ampicillin (OMNIPEN) IV  1 g Intravenous Q4H  . lactated ringers  500 mL Intravenous Once   Infusions:  . fentaNYL 2.5 mcg/ml w/bupivacaine 1/10% in NS epidural infusion ( total) 14 mL/hr (01/21/13 0331)  . lactated ringers 1,000 mL (01/20/13 1252)  . oxytocin 40 units in LR 1000 mL    . oxytocin 40 units in LR 1000 mL    . oxytocin 40 units in LR 1000 mL 10 milli-units/min (01/20/13 1635)   PRN: acetaminophen, butorphanol, citric acid-sodium citrate, diphenhydrAMINE, ePHEDrine, ePHEDrine, fentaNYL 2.5 mcg/ml w/bupivacaine 1/10% in NS epidural infusion ( total), ibuprofen, lactated ringers, lidocaine (PF), misoprostol, ondansetron, oxyCODONE-acetaminophen, phenylephrine, phenylephrine, sodium phosphate, zolpidem  Assessment:  37yr old female G2P1 with gestational HTN  Term gestation  ROM  Now with maternal fever, R/O chorioamniotis  Goal of Therapy:   Desire gentamicin peak serum level 6-63mcg/ml and trough serum level <1 mcg/ml  Plan:  1.   Loading dose 170mg  x 1, and then 2. Maintenance regimen 150mg  IV q8h (if tx continued) 3.  Will order serum gentamicin levels if tx continues >48hr, or as clinically indicated  Scarlett Presto 01/21/2013,6:17 AM

## 2013-01-21 NOTE — Consult Note (Signed)
Neonatology Note:  Attendance at C-section:  I was asked by Dr. Lowe to attend this primary C/S at term due to FTP. The mother is a G2P0A1 A pos, GBS neg with panic attacks and gestational HTN, on no medication. ROM 23 hours prior to delivery, fluid clear. Mother had some slight temp elevation today and received Ampicillin and Gentamicin > 4 hours before delivery. Infant vigorous with good spontaneous cry and tone. Needed only minimal bulb suctioning. Ap 9/9. Lungs clear to ausc in DR. To CN to care of Pediatrician.  September Mormile C. Quetzali Heinle, MD  

## 2013-01-22 ENCOUNTER — Encounter (HOSPITAL_COMMUNITY): Payer: Self-pay | Admitting: Obstetrics and Gynecology

## 2013-01-22 LAB — CBC
Platelets: 270 10*3/uL (ref 150–400)
RBC: 3.05 MIL/uL — ABNORMAL LOW (ref 3.87–5.11)
RDW: 14.4 % (ref 11.5–15.5)
WBC: 16.8 10*3/uL — ABNORMAL HIGH (ref 4.0–10.5)

## 2013-01-22 NOTE — Progress Notes (Signed)
Subjective: Postpartum Day 1: Cesarean Delivery Patient reports tolerating PO.    Objective: Vital signs in last 24 hours: Temp:  [98 F (36.7 C)-99.7 F (37.6 C)] 98.2 F (36.8 C) (08/22 0545) Pulse Rate:  [71-130] 82 (08/22 0545) Resp:  [11-26] 18 (08/22 0545) BP: (100-146)/(44-91) 128/80 mmHg (08/22 0545) SpO2:  [95 %-98 %] 97 % (08/22 0545)  Physical Exam:  General: alert and cooperative Lochia: appropriate Uterine Fundus: firm Incision: honeycomb dressing CDI DVT Evaluation: No evidence of DVT seen on physical exam. Negative Homan's sign. No cords or calf tenderness.   Recent Labs  01/21/13 1747 01/22/13 0550  HGB 10.8* 9.8*  HCT 31.4* 29.1*    Assessment/Plan: Status post Cesarean section. Doing well postoperatively.  Continue current care.  Terri Lopez G 01/22/2013, 8:37 AM

## 2013-01-22 NOTE — Anesthesia Postprocedure Evaluation (Signed)
  Anesthesia Post-op Note  Patient: Terri Lopez  Procedure(s) Performed: Procedure(s): CESAREAN SECTION (N/A)  Patient Location: PACU and Mother/Baby  Anesthesia Type:Spinal  Level of Consciousness: awake, alert , oriented and patient cooperative  Airway and Oxygen Therapy: Patient Spontanous Breathing  Post-op Pain: none  Post-op Assessment: Post-op Vital signs reviewed, Patient's Cardiovascular Status Stable and Respiratory Function Stable  Post-op Vital Signs: Reviewed and stable  Complications: No apparent anesthesia complications

## 2013-01-22 NOTE — Progress Notes (Signed)
Patient was referred for history of depression/anxiety.  * Referral screened out by Clinical Social Worker because none of the following criteria appear to apply:  ~ History of anxiety/depression during this pregnancy, or of post-partum depression.  ~ Diagnosis of anxiety and/or depression within last 3 years  ~ History of depression due to pregnancy loss/loss of child  OR  * Patient's symptoms currently being treated with medication and/or therapy.  Please contact the Clinical Social Worker if needs arise, or by the patient's request.  Pt's anxiety was described as situational. She stopped taking the medication at pregnancy confirmation & has coped well. FOB at bedside & supportive. Denies hx of depression.       

## 2013-01-23 MED ORDER — OXYCODONE-ACETAMINOPHEN 7.5-325 MG PO TABS: 1.0000 | ORAL_TABLET | ORAL | Status: DC | PRN

## 2013-01-23 NOTE — Progress Notes (Signed)
Subjective: Postpartum Day two: Cesarean Delivery Patient reports tolerating PO, + flatus and no problems voiding.    Objective: Vital signs in last 24 hours: Temp:  [98.1 F (36.7 C)-98.6 F (37 C)] 98.4 F (36.9 C) (08/23 0540) Pulse Rate:  [79-110] 97 (08/23 0540) Resp:  [18-20] 20 (08/23 0540) BP: (102-142)/(67-84) 142/84 mmHg (08/23 0540) SpO2:  [98 %] 98 % (08/23 0540)  Physical Exam:  General: alert Lochia: appropriate Uterine Fundus: firm Incision: healing well DVT Evaluation: No evidence of DVT seen on physical exam.   Recent Labs  01/21/13 1747 01/22/13 0550  HGB 10.8* 9.8*  HCT 31.4* 29.1*    Assessment/Plan: Status post Cesarean section. Doing well postoperatively.  Discharge home with standard precautions and return to clinic in 4-6 weeks.  Anaiz Qazi S 01/23/2013, 8:24 AM

## 2013-01-23 NOTE — Discharge Summary (Signed)
Obstetric Discharge Summary Reason for Admission: induction of labor Prenatal Procedures: Preeclampsia Intrapartum Procedures: cesarean: low cervical, transverse Postpartum Procedures: none Complications-Operative and Postpartum: none Hemoglobin  Date Value Range Status  01/22/2013 9.8* 12.0 - 15.0 g/dL Final     HCT  Date Value Range Status  01/22/2013 29.1* 36.0 - 46.0 % Final    Physical Exam:  General: alert Lochia: appropriate Uterine Fundus: firm Incision: healing well DVT Evaluation: No evidence of DVT seen on physical exam.  Discharge Diagnoses: Term Pregnancy-delivered, Amnionitis and Preelampsia  Discharge Information: Date: 01/23/2013 Activity: pelvic rest Diet: routine Medications: PNV and Percocet Condition: stable Instructions: refer to practice specific booklet Discharge to: home   Newborn Data: Live born female  Birth Weight: 8 lb 1.3 oz (3666 g) APGAR: 9, 9  Home with mother.  Terri Lopez S 01/23/2013, 8:26 AM

## 2013-02-10 NOTE — Addendum Note (Signed)
Addendum created 02/10/13 1912 by Sandrea Hughs., MD   Modules edited: Anesthesia Responsible Staff

## 2014-03-27 ENCOUNTER — Encounter (HOSPITAL_COMMUNITY): Payer: Self-pay | Admitting: Emergency Medicine

## 2014-03-27 ENCOUNTER — Emergency Department (INDEPENDENT_AMBULATORY_CARE_PROVIDER_SITE_OTHER)
Admission: EM | Admit: 2014-03-27 | Discharge: 2014-03-27 | Disposition: A | Discharge status: Home / Self Care | Payer: Self-pay | Source: Home / Self Care | Attending: Emergency Medicine | Admitting: Emergency Medicine

## 2014-03-27 DIAGNOSIS — N200 Calculus of kidney: Secondary | ICD-10-CM

## 2014-03-27 LAB — POCT URINALYSIS DIP (DEVICE)
BILIRUBIN URINE: NEGATIVE
Glucose, UA: NEGATIVE mg/dL
KETONES UR: NEGATIVE mg/dL
LEUKOCYTES UA: NEGATIVE
Nitrite: NEGATIVE
PH: 6 (ref 5.0–8.0)
Protein, ur: NEGATIVE mg/dL
Urobilinogen, UA: 0.2 mg/dL (ref 0.0–1.0)

## 2014-03-27 LAB — POCT PREGNANCY, URINE: Preg Test, Ur: NEGATIVE

## 2014-03-27 MED ORDER — HYDROMORPHONE HCL 1 MG/ML IJ SOLN
INTRAMUSCULAR | Status: AC
  Filled 2014-03-27: qty 2

## 2014-03-27 MED ORDER — KETOROLAC TROMETHAMINE 60 MG/2ML IM SOLN
INTRAMUSCULAR | Status: AC
  Filled 2014-03-27: qty 2

## 2014-03-27 MED ORDER — ONDANSETRON 4 MG PO TBDP
8.0000 mg | ORAL_TABLET | Freq: Once | ORAL | Status: AC
  Administered 2014-03-27: 8 mg via ORAL

## 2014-03-27 MED ORDER — TAMSULOSIN HCL 0.4 MG PO CAPS: 0.4000 mg | ORAL_CAPSULE | Freq: Every day | ORAL | Status: DC

## 2014-03-27 MED ORDER — ONDANSETRON 8 MG PO TBDP: 8.0000 mg | ORAL_TABLET | Freq: Three times a day (TID) | ORAL | Status: DC | PRN

## 2014-03-27 MED ORDER — HYDROMORPHONE HCL 1 MG/ML IJ SOLN
2.0000 mg | Freq: Once | INTRAMUSCULAR | Status: AC
  Administered 2014-03-27: 2 mg via INTRAMUSCULAR

## 2014-03-27 MED ORDER — ONDANSETRON 4 MG PO TBDP
ORAL_TABLET | ORAL | Status: AC
  Filled 2014-03-27: qty 2

## 2014-03-27 MED ORDER — OXYCODONE-ACETAMINOPHEN 5-325 MG PO TABS
ORAL_TABLET | ORAL | Status: DC
Start: 2014-03-27 — End: 2015-05-11

## 2014-03-27 MED ORDER — KETOROLAC TROMETHAMINE 60 MG/2ML IM SOLN
60.0000 mg | Freq: Once | INTRAMUSCULAR | Status: AC
  Administered 2014-03-27: 60 mg via INTRAMUSCULAR

## 2014-03-27 NOTE — Discharge Instructions (Signed)

## 2014-03-27 NOTE — ED Notes (Signed)
C/o L lower back pain onset 1100.  No known injury.  No burning, or frequency of urination.  No hematuria.

## 2014-03-27 NOTE — ED Provider Notes (Signed)
Chief Complaint   Back Pain   History of Present Illness   Terri Lopez is a 38 year old female who experienced sudden onset of sharp, severe left CVA pain this morning at 11 AM while she was cleaning her house. She denies lifting anything heavy or any injury to her back. The pain is persisted and she cannot find a comfortable position. It hurts somewhat to move and to breathe. She's felt somewhat chilled and nauseated. She's had no fever or vomiting. She denies any personal history of kidney stones. Her father had kidney stones. She denies any abdominal pain, dysuria, frequency, urgency, or hematuria. There is no radiation of the pain down the leg, no numbness, tingling, muscle weakness, bladder or bowel dysfunction, or saddle anesthesia. The patient is on her menses right now.  Review of Systems   Other than as noted above, the patient denies any of the following symptoms: Systemic:  No fever, chills, or unexplained weight loss. GI:  No abdominal pain or incontinence of bowel. GU:  No dysuria, frequency, urgency, or hematuria. No incontinence of urine or urinary retention.  M-S:  No neck pain or arthritis. Neuro:  No paresthesias, headache, saddle anesthesia, muscular weakness, or progressive neurological deficit.  PMFSH   Past medical history, family history, social history, meds, and allergies were reviewed. Specifically, there is no history of cancer, major trauma, osteoporosis, immunosuppression, or HIV infection. She takes Synthroid and occasional Xanax.  Physical Examination    Vital signs:  BP 130/87  Pulse 72  Temp(Src) 98.2 F (36.8 C) (Oral)  Resp 16  SpO2 96%  LMP 03/23/2014  Breastfeeding? No General:  Alert, oriented, in no distress. Abdomen:  Soft, with mild left flank tenderness to palpation, no guarding or rebound.  No organomegaly or mass.  No pulsatile midline abdominal mass or bruit. Back:  She has minimal left CVA tenderness to percussion. Her back has a  full range of motion with some pain on forward bending. Straight leg raising was negative. Neuro:  Normal muscle strength, sensations and DTRs. Extremities: Pedal pulses were full, there was no edema. Skin:  Clear, warm and dry.  No rash.  Labs   Results for orders placed during the hospital encounter of 03/27/14  POCT URINALYSIS DIP (DEVICE)      Result Value Ref Range   Glucose, UA NEGATIVE  NEGATIVE mg/dL   Bilirubin Urine NEGATIVE  NEGATIVE   Ketones, ur NEGATIVE  NEGATIVE mg/dL   Specific Gravity, Urine <=1.005  1.005 - 1.030   Hgb urine dipstick SMALL (*) NEGATIVE   pH 6.0  5.0 - 8.0   Protein, ur NEGATIVE  NEGATIVE mg/dL   Urobilinogen, UA 0.2  0.0 - 1.0 mg/dL   Nitrite NEGATIVE  NEGATIVE   Leukocytes, UA NEGATIVE  NEGATIVE  POCT PREGNANCY, URINE      Result Value Ref Range   Preg Test, Ur NEGATIVE  NEGATIVE    Course in Urgent Care Center   The following medications were given:  Medications  ketorolac (TORADOL) injection 60 mg   HYDROmorphone (DILAUDID) injection 2 mg   ondansetron (ZOFRAN-ODT) disintegrating tablet 8 mg    Assessment   The encounter diagnosis was Kidney stone.  No evidence of cauda equina syndrome, discitis, epidural abscess, fracture, acute pyelonephritis, bleed, cancer, or aneurism.    Plan     1.  Meds:  The following meds were prescribed:   New Prescriptions   ONDANSETRON (ZOFRAN ODT) 8 MG DISINTEGRATING TABLET    Take 1  tablet (8 mg total) by mouth every 8 (eight) hours as needed for nausea.   OXYCODONE-ACETAMINOPHEN (PERCOCET) 5-325 MG PER TABLET    1 to 2 tablets every 6 hours as needed for pain.   TAMSULOSIN (FLOMAX) 0.4 MG CAPS CAPSULE    Take 1 capsule (0.4 mg total) by mouth daily.    2.  Patient Education/Counseling:  The patient was given appropriate handouts, self care instructions, and instructed in symptomatic relief. The patient was encouraged to get extra fluids and strain her urine.  3.  Follow up:  The patient was told to  follow up here if no better in 3 to 4 days, or sooner if becoming worse in any way, and given some red flag symptoms such as fever, worsening or changing pain or new neurological symptoms which would prompt immediate return.  Follow up with Dr. Heloise PurpuraLester Borden in 1-2 days.     Reuben Likesavid C Melquisedec Journey, MD 03/27/14 [phone]

## 2014-04-04 ENCOUNTER — Encounter (HOSPITAL_COMMUNITY): Payer: Self-pay | Admitting: Emergency Medicine

## 2014-10-24 LAB — OB RESULTS CONSOLE HEPATITIS B SURFACE ANTIGEN: Hepatitis B Surface Ag: NEGATIVE

## 2014-10-24 LAB — OB RESULTS CONSOLE GC/CHLAMYDIA
Chlamydia: NEGATIVE
Gonorrhea: NEGATIVE

## 2014-10-24 LAB — OB RESULTS CONSOLE RUBELLA ANTIBODY, IGM: Rubella: IMMUNE

## 2014-10-24 LAB — OB RESULTS CONSOLE ABO/RH: RH TYPE: POSITIVE

## 2014-10-24 LAB — OB RESULTS CONSOLE RPR: RPR: NONREACTIVE

## 2014-10-24 LAB — OB RESULTS CONSOLE ANTIBODY SCREEN: ANTIBODY SCREEN: NEGATIVE

## 2014-10-24 LAB — OB RESULTS CONSOLE HIV ANTIBODY (ROUTINE TESTING): HIV: NONREACTIVE

## 2015-03-14 ENCOUNTER — Other Ambulatory Visit: Payer: Self-pay | Admitting: Obstetrics and Gynecology

## 2015-05-19 ENCOUNTER — Encounter (HOSPITAL_COMMUNITY): Payer: Self-pay

## 2015-05-21 DIAGNOSIS — Z9889 Other specified postprocedural states: Secondary | ICD-10-CM

## 2015-05-21 DIAGNOSIS — B009 Herpesviral infection, unspecified: Secondary | ICD-10-CM | POA: Diagnosis not present

## 2015-05-21 DIAGNOSIS — F419 Anxiety disorder, unspecified: Secondary | ICD-10-CM | POA: Diagnosis not present

## 2015-05-21 DIAGNOSIS — B341 Enterovirus infection, unspecified: Secondary | ICD-10-CM | POA: Diagnosis not present

## 2015-05-21 DIAGNOSIS — O3440 Maternal care for other abnormalities of cervix, unspecified trimester: Secondary | ICD-10-CM

## 2015-05-21 DIAGNOSIS — E559 Vitamin D deficiency, unspecified: Secondary | ICD-10-CM | POA: Diagnosis not present

## 2015-05-21 DIAGNOSIS — Z98891 History of uterine scar from previous surgery: Secondary | ICD-10-CM

## 2015-05-21 DIAGNOSIS — O09529 Supervision of elderly multigravida, unspecified trimester: Secondary | ICD-10-CM

## 2015-05-21 DIAGNOSIS — B9711 Coxsackievirus as the cause of diseases classified elsewhere: Secondary | ICD-10-CM | POA: Diagnosis not present

## 2015-05-21 DIAGNOSIS — E039 Hypothyroidism, unspecified: Secondary | ICD-10-CM | POA: Diagnosis present

## 2015-05-21 HISTORY — DX: History of uterine scar from previous surgery: Z98.891

## 2015-05-21 NOTE — H&P (Signed)
Terri Milleramela A Lopez is a 39 y.o. female, G2P1001 on 05/25/15, at 39 weeks, presenting for scheduled repeat cesarean and BTL.  Denies leaking or bleeding, reports +FM.    Patient Active Problem List   Diagnosis Date Noted  . Previous cesarean section 05/21/2015  . Hypothyroidism--hx thyroidectomy due to Graves' dx. 05/21/2015  . Vitamin D deficiency 05/21/2015  . Herpes simplex 05/21/2015  . Anxiety disorder 05/21/2015  . AMA (advanced maternal age) multigravida 35+ 05/21/2015  . Coxsackie viral disease--stable titers during pregnancy 05/21/2015  . H/O cryosurgery of cervix complicating pregnancy--1996 05/21/2015    History of present pregnancy: Patient entered care at 10 weeks.   EDC of 05/30/15 was established by US at 10 weeks.   Anatomy scan:  19 weeks, with limited anatomy findings and a posterior placenta.  EFW 69%ile, cervix closed. Additional US evaluations:  None.   Significant prenatal events:  On Synthroid prior to pregnancy, with TSH and free T4 WNL at 13 weeks.  Treated for BV at that time.  Papers signed for BTL at 19 weeks on 01/03/15..  Had more issues with anxiety/anger during pregnancy, declined Zoloft.  Vit D noted to be low at 27 weeks, placed on 4000 IU supplementation.  + coxsackie titers noted at 29 weeks, titers for Type B stable at 1:8 on several assessments, type A negative. TDAP 03/14/15, flu vaccine 04/11/15.Marland Kitchen.  Desired repeat C/S with BTL.  TSH noted to be elevated at 36 weeks, referred to endocrinologist.  Bile acids and LFTs evaluated at 33 weeks due to itching--WNL.  Treated for URI at 35 weeks with Zpak.   Last evaluation:  05/16/15--BP 120/74, weight 207.5, cervix closed, long, vtx, -4.  OB History    Gravida Para Term Preterm AB TAB SAB Ectopic Multiple Living   3 1 1  1 1    1     ? Year--TAB 2014--[facility], 39 4/7 weeks, 48 hour labor, 8+6, epidural, gestational hypertension, chorioamnionitis, arrest of dilation   Past Medical History  Diagnosis Date  .  Panic attacks   . Medical history non-contributory    Past Surgical History  Procedure Laterality Date  . Thyroid surgery  2006  . Cesarean section N/A 01/21/2013    Procedure: CESAREAN SECTION;  Surgeon: Turner Danielsavid C Lowe, MD;  Location: [facility];  Service: Obstetrics;  Laterality: N/A;   Family History: family history includes Diabetes in her mother., CAD, hypothyroidism; MGF MI, HTN; MA aneurysm; PGM dementia  Social History:  reports that she has been smoking Cigarettes.  She has been smoking about 0.50 packs per day. She has never used smokeless tobacco. She reports that she drinks alcohol. She reports that she does not use illicit drugs. Patient is Caucasian, with 2 years college, employed as Associate Professorcosmetologist, single, with  FOB, Gomez Cleverlyhomas Whitfield, involved and supportive.  Prenatal Transfer Tool  Maternal Diabetes: No Genetic Screening: Normal Quad screen Maternal Ultrasounds/Referrals: Normal Fetal Ultrasounds or other Referrals:  None Maternal Substance Abuse:  No Significant Maternal Medications:  Meds include: Syntroid Significant Maternal Lab Results: Lab values include: Group B Strep negative, Other:  05/09/15--Free T3 2.0, low.  05/02/15--TSH 9.101--referred to endocrinologist. Positive Coxsackie titers during pregnancy, but stable at 1:8--no increases during pregnancy.  TDAP 03/14/15 Flu 04/11/15  ROS:  Occasional UCs, +FM  No Known Allergies  Filed Vitals:   05/23/15 0831 05/23/15 0832  BP:  131/92  Pulse: 85   Temp: 98.1 F (36.7 C)   TempSrc: Oral   Resp: 20   SpO2:  100%      Blood pressure 131/92, pulse 85, temperature 98.1 F (36.7 C), temperature source Oral, resp. rate 20, last menstrual period 08/23/2014, SpO2 100 %.  Chest clear Heart RRR without murmur Abd gravid, NT, FH 38 cm Pelvic: Closed, long, vtx, -4 at last OB visit on 05/16/15 Ext: DTR 1+, no clonus, trace edema.  FHR: 140 at last visit 05/16/15 UCs:  Occasional per patient.  Prenatal labs: ABO,  Rh: --/--/A POS (12/19 0910) Antibody: NEG (12/19 0910) Rubella:  Immune RPR: Non Reactive (12/19 0913)  HBsAg: Negative (05/23 0000)  HIV: Non-reactive (05/23 0000)  GBS:  Negative 05/02/15 Sickle cell/Hgb electrophoresis:  NA Pap:  2014, WNL GC:  Negative 10/24/14 Chlamydia:  Negative 10/24/14 Genetic screenings:  Normal Quad screen Glucola:  Normal x 2 Other:   Hgb 14.4 at NOB, 11.9 at 28 weeks TSH 0.501 on 10/19/14, 1.030 on 11/22/14, 3.868 on 02/28/15, 9.101 on 05/02/15 T4 free 1.21 on 11/22/14, 0.81 on 02/28/15, 0.93 on 05/09/15 T3 free 2.0 on 05/09/15 Vit D 28 on 12/20/14, 32 on 02/28/15 Coxsackie antibodies: 02/28/15--B type 2, B type 5, B type 6 = 1:8 02/28/15--A types all negative 03/16/15--B type 5, B type 6 = 1:8 03/28/15--A types all negative 03/28/15--B type 2, B type 5 1:8 04/05/15--B type 2, B type 5 = 1:8 05/09/15--B type 1, B type 2, B type 5, B type 6 = 1:8 05/09/15--A types all negative 04/12/15--Bile acids WNL, LFTs WNL.  Assessment/Plan: IUP at 39 1/7 weeks Previous C/S, desires repeat, with BTL. GBS negative Hx anxiety disorder Hypothyroidism--on Synthroid Herpes simplex--no recent/current outbreaks Coxsackie virus during pregnancy--stable titers Mild elevation of BP  Plan: Admit to [facility] per consult with Dr. Estanislado Pandy for scheduled repeat C/S with BTL. Routine [facility] pre-op orders Synthroid per Dr. Cloretta Ned post-op orders SW consult prn for hx of anxiety BTL consent in shadow chart Continue to monitor BP.   Javaris Wigington, [place], [place] 05/23/2015, 8:58 AM

## 2015-05-22 ENCOUNTER — Encounter (HOSPITAL_COMMUNITY)
Admission: RE | Admit: 2015-05-22 | Discharge: 2015-05-22 | Disposition: A | Discharge status: Home / Self Care | Payer: Medicaid Other | Source: Ambulatory Visit | Attending: Obstetrics and Gynecology | Admitting: Obstetrics and Gynecology

## 2015-05-22 ENCOUNTER — Encounter (HOSPITAL_COMMUNITY): Payer: Self-pay

## 2015-05-22 HISTORY — DX: Other specified health status: Z78.9

## 2015-05-22 LAB — TYPE AND SCREEN
ABO/RH(D): A POS
ANTIBODY SCREEN: NEGATIVE

## 2015-05-22 LAB — CBC
HEMATOCRIT: 34.7 % — AB (ref 36.0–46.0)
Hemoglobin: 11.5 g/dL — ABNORMAL LOW (ref 12.0–15.0)
MCH: 33.1 pg (ref 26.0–34.0)
MCHC: 33.1 g/dL (ref 30.0–36.0)
MCV: 100 fL (ref 78.0–100.0)
Platelets: 402 10*3/uL — ABNORMAL HIGH (ref 150–400)
RBC: 3.47 MIL/uL — ABNORMAL LOW (ref 3.87–5.11)
RDW: 13.2 % (ref 11.5–15.5)
WBC: 12.9 10*3/uL — ABNORMAL HIGH (ref 4.0–10.5)

## 2015-05-22 NOTE — Patient Instructions (Signed)
Your procedure is scheduled on:05/23/15  Enter through the Main Entrance at :0830 am Pick up desk phone and dial [phone] and inform us of your arrival.  Please call [phone] if you have any problems the morning of surgery.  Remember: Do not eat food after midnight:tonight Clear liquids are ok until:6am Tuesday   You may brush your teeth the morning of surgery.  Take these meds the morning of surgery with a sip of water:Thyroid med  DO NOT wear jewelry, eye make-up, lipstick,body lotion, or dark fingernail polish.  (Polished toes are ok) You may wear deodorant.  If you are to be admitted after surgery, leave suitcase in car until your room has been assigned. Patients discharged on the day of surgery will not be allowed to drive home. Wear loose fitting, comfortable clothes for your ride home.

## 2015-05-23 ENCOUNTER — Encounter (HOSPITAL_COMMUNITY): Payer: Self-pay | Admitting: Anesthesiology

## 2015-05-23 ENCOUNTER — Inpatient Hospital Stay (HOSPITAL_COMMUNITY): Payer: Medicaid Other | Admitting: Anesthesiology

## 2015-05-23 ENCOUNTER — Encounter (HOSPITAL_COMMUNITY)
Admission: RE | Disposition: A | Discharge status: Home / Self Care | Payer: Self-pay | Source: Ambulatory Visit | Attending: Obstetrics and Gynecology

## 2015-05-23 ENCOUNTER — Inpatient Hospital Stay (HOSPITAL_COMMUNITY)
Admission: RE | Admit: 2015-05-23 | Discharge: 2015-05-25 | DRG: 766 | Disposition: A | Discharge status: Home / Self Care | Payer: Medicaid Other | Source: Ambulatory Visit | Attending: Obstetrics and Gynecology | Admitting: Obstetrics and Gynecology

## 2015-05-23 DIAGNOSIS — B9711 Coxsackievirus as the cause of diseases classified elsewhere: Secondary | ICD-10-CM | POA: Diagnosis not present

## 2015-05-23 DIAGNOSIS — O09529 Supervision of elderly multigravida, unspecified trimester: Secondary | ICD-10-CM

## 2015-05-23 DIAGNOSIS — F17210 Nicotine dependence, cigarettes, uncomplicated: Secondary | ICD-10-CM | POA: Diagnosis present

## 2015-05-23 DIAGNOSIS — O99334 Smoking (tobacco) complicating childbirth: Secondary | ICD-10-CM | POA: Diagnosis present

## 2015-05-23 DIAGNOSIS — D649 Anemia, unspecified: Secondary | ICD-10-CM | POA: Diagnosis not present

## 2015-05-23 DIAGNOSIS — O9902 Anemia complicating childbirth: Secondary | ICD-10-CM | POA: Diagnosis not present

## 2015-05-23 DIAGNOSIS — E559 Vitamin D deficiency, unspecified: Secondary | ICD-10-CM | POA: Diagnosis not present

## 2015-05-23 DIAGNOSIS — O34211 Maternal care for low transverse scar from previous cesarean delivery: Secondary | ICD-10-CM | POA: Diagnosis present

## 2015-05-23 DIAGNOSIS — E039 Hypothyroidism, unspecified: Secondary | ICD-10-CM | POA: Diagnosis present

## 2015-05-23 DIAGNOSIS — Z302 Encounter for sterilization: Secondary | ICD-10-CM

## 2015-05-23 DIAGNOSIS — Z3483 Encounter for supervision of other normal pregnancy, third trimester: Secondary | ICD-10-CM | POA: Diagnosis present

## 2015-05-23 DIAGNOSIS — Z3A39 39 weeks gestation of pregnancy: Secondary | ICD-10-CM

## 2015-05-23 DIAGNOSIS — B009 Herpesviral infection, unspecified: Secondary | ICD-10-CM | POA: Diagnosis not present

## 2015-05-23 DIAGNOSIS — B341 Enterovirus infection, unspecified: Secondary | ICD-10-CM | POA: Diagnosis not present

## 2015-05-23 DIAGNOSIS — O99284 Endocrine, nutritional and metabolic diseases complicating childbirth: Secondary | ICD-10-CM | POA: Diagnosis present

## 2015-05-23 DIAGNOSIS — Z9889 Other specified postprocedural states: Secondary | ICD-10-CM

## 2015-05-23 DIAGNOSIS — F419 Anxiety disorder, unspecified: Secondary | ICD-10-CM | POA: Diagnosis not present

## 2015-05-23 DIAGNOSIS — Z98891 History of uterine scar from previous surgery: Secondary | ICD-10-CM

## 2015-05-23 DIAGNOSIS — O3440 Maternal care for other abnormalities of cervix, unspecified trimester: Secondary | ICD-10-CM

## 2015-05-23 LAB — RPR: RPR Ser Ql: NONREACTIVE

## 2015-05-23 SURGERY — Surgical Case
Anesthesia: Spinal | Laterality: Bilateral

## 2015-05-23 MED ORDER — NALOXONE HCL 0.4 MG/ML IJ SOLN: 0.4000 mg | INTRAMUSCULAR | Status: DC | PRN

## 2015-05-23 MED ORDER — SODIUM CHLORIDE 0.9 % IJ SOLN: 3.0000 mL | INTRAMUSCULAR | Status: DC | PRN

## 2015-05-23 MED ORDER — NALBUPHINE HCL 10 MG/ML IJ SOLN: 5.0000 mg | Freq: Once | INTRAMUSCULAR | Status: DC | PRN

## 2015-05-23 MED ORDER — CEFAZOLIN SODIUM-DEXTROSE 2-3 GM-% IV SOLR
2.0000 g | INTRAVENOUS | Status: AC
  Administered 2015-05-23: 2 g via INTRAVENOUS

## 2015-05-23 MED ORDER — ONDANSETRON HCL 4 MG/2ML IJ SOLN
INTRAMUSCULAR | Status: AC
Start: 2015-05-23 — End: 2015-05-23
  Filled 2015-05-23: qty 2

## 2015-05-23 MED ORDER — SIMETHICONE 80 MG PO CHEW
80.0000 mg | CHEWABLE_TABLET | ORAL | Status: DC
  Administered 2015-05-24 (×2): 80 mg via ORAL
  Filled 2015-05-23 (×2): qty 1

## 2015-05-23 MED ORDER — LACTATED RINGERS IV SOLN
INTRAVENOUS | Status: DC
  Administered 2015-05-23: 20:00:00 via INTRAVENOUS

## 2015-05-23 MED ORDER — OXYCODONE-ACETAMINOPHEN 5-325 MG PO TABS
2.0000 | ORAL_TABLET | ORAL | Status: DC | PRN
  Administered 2015-05-24 – 2015-05-25 (×4): 2 via ORAL
  Filled 2015-05-23 (×4): qty 2

## 2015-05-23 MED ORDER — METHYLERGONOVINE MALEATE 0.2 MG/ML IJ SOLN: 0.2000 mg | INTRAMUSCULAR | Status: DC | PRN

## 2015-05-23 MED ORDER — BUPIVACAINE HCL (PF) 0.25 % IJ SOLN
INTRAMUSCULAR | Status: DC | PRN
  Administered 2015-05-23: 20 mL

## 2015-05-23 MED ORDER — LACTATED RINGERS IV SOLN: INTRAVENOUS | Status: DC

## 2015-05-23 MED ORDER — MORPHINE SULFATE (PF) 0.5 MG/ML IJ SOLN
INTRAMUSCULAR | Status: DC | PRN
  Administered 2015-05-23: .2 mg via EPIDURAL

## 2015-05-23 MED ORDER — DIPHENHYDRAMINE HCL 25 MG PO CAPS: 25.0000 mg | ORAL_CAPSULE | Freq: Four times a day (QID) | ORAL | Status: DC | PRN

## 2015-05-23 MED ORDER — SIMETHICONE 80 MG PO CHEW
80.0000 mg | CHEWABLE_TABLET | Freq: Three times a day (TID) | ORAL | Status: DC
  Administered 2015-05-23 – 2015-05-25 (×5): 80 mg via ORAL
  Filled 2015-05-23 (×5): qty 1

## 2015-05-23 MED ORDER — SCOPOLAMINE 1 MG/3DAYS TD PT72
MEDICATED_PATCH | TRANSDERMAL | Status: AC
  Administered 2015-05-23: 1.5 mg via TRANSDERMAL
  Filled 2015-05-23: qty 1

## 2015-05-23 MED ORDER — IBUPROFEN 600 MG PO TABS
600.0000 mg | ORAL_TABLET | Freq: Four times a day (QID) | ORAL | Status: DC
  Administered 2015-05-23 – 2015-05-25 (×8): 600 mg via ORAL
  Filled 2015-05-23 (×8): qty 1

## 2015-05-23 MED ORDER — MORPHINE SULFATE (PF) 0.5 MG/ML IJ SOLN
INTRAMUSCULAR | Status: AC
  Filled 2015-05-23: qty 10

## 2015-05-23 MED ORDER — ZOLPIDEM TARTRATE 5 MG PO TABS: 5.0000 mg | ORAL_TABLET | Freq: Every evening | ORAL | Status: DC | PRN

## 2015-05-23 MED ORDER — FENTANYL CITRATE (PF) 100 MCG/2ML IJ SOLN
INTRAMUSCULAR | Status: DC | PRN
  Administered 2015-05-23: 10 ug via INTRATHECAL

## 2015-05-23 MED ORDER — DIPHENHYDRAMINE HCL 50 MG/ML IJ SOLN
12.5000 mg | INTRAMUSCULAR | Status: DC | PRN
Start: 2015-05-23 — End: 2015-05-25

## 2015-05-23 MED ORDER — MENTHOL 3 MG MT LOZG: 1.0000 | LOZENGE | OROMUCOSAL | Status: DC | PRN

## 2015-05-23 MED ORDER — CEFAZOLIN SODIUM-DEXTROSE 2-3 GM-% IV SOLR
INTRAVENOUS | Status: AC
  Filled 2015-05-23: qty 50

## 2015-05-23 MED ORDER — LEVOTHYROXINE SODIUM 175 MCG PO TABS
175.0000 ug | ORAL_TABLET | Freq: Every day | ORAL | Status: DC
Start: 2015-05-24 — End: 2015-05-25
  Administered 2015-05-24 – 2015-05-25 (×2): 175 ug via ORAL
  Filled 2015-05-23 (×2): qty 1

## 2015-05-23 MED ORDER — NALBUPHINE HCL 10 MG/ML IJ SOLN: 5.0000 mg | INTRAMUSCULAR | Status: DC | PRN

## 2015-05-23 MED ORDER — WITCH HAZEL-GLYCERIN EX PADS: 1.0000 "application " | MEDICATED_PAD | CUTANEOUS | Status: DC | PRN

## 2015-05-23 MED ORDER — LACTATED RINGERS IV SOLN
INTRAVENOUS | Status: DC
  Administered 2015-05-23 (×3): via INTRAVENOUS

## 2015-05-23 MED ORDER — DIPHENHYDRAMINE HCL 25 MG PO CAPS
25.0000 mg | ORAL_CAPSULE | ORAL | Status: DC | PRN
  Filled 2015-05-23: qty 1

## 2015-05-23 MED ORDER — OXYTOCIN 10 UNIT/ML IJ SOLN
40.0000 [IU] | INTRAVENOUS | Status: DC | PRN
  Administered 2015-05-23: 40 [IU] via INTRAVENOUS

## 2015-05-23 MED ORDER — SIMETHICONE 80 MG PO CHEW
80.0000 mg | CHEWABLE_TABLET | ORAL | Status: DC | PRN
  Administered 2015-05-24: 80 mg via ORAL
  Filled 2015-05-23: qty 1

## 2015-05-23 MED ORDER — FENTANYL CITRATE (PF) 100 MCG/2ML IJ SOLN
INTRAMUSCULAR | Status: AC
  Filled 2015-05-23: qty 2

## 2015-05-23 MED ORDER — ACETAMINOPHEN 325 MG PO TABS
650.0000 mg | ORAL_TABLET | ORAL | Status: DC | PRN
  Administered 2015-05-23: 650 mg via ORAL
  Filled 2015-05-23: qty 2

## 2015-05-23 MED ORDER — TETANUS-DIPHTH-ACELL PERTUSSIS 5-2.5-18.5 LF-MCG/0.5 IM SUSP: 0.5000 mL | Freq: Once | INTRAMUSCULAR | Status: DC

## 2015-05-23 MED ORDER — PROMETHAZINE HCL 25 MG/ML IJ SOLN: 6.2500 mg | INTRAMUSCULAR | Status: DC | PRN

## 2015-05-23 MED ORDER — BUPIVACAINE IN DEXTROSE 0.75-8.25 % IT SOLN
INTRATHECAL | Status: DC | PRN
  Administered 2015-05-23: 1.4 mL via INTRATHECAL

## 2015-05-23 MED ORDER — ONDANSETRON HCL 4 MG/2ML IJ SOLN: 4.0000 mg | Freq: Three times a day (TID) | INTRAMUSCULAR | Status: DC | PRN

## 2015-05-23 MED ORDER — FENTANYL CITRATE (PF) 100 MCG/2ML IJ SOLN: 25.0000 ug | INTRAMUSCULAR | Status: DC | PRN

## 2015-05-23 MED ORDER — SCOPOLAMINE 1 MG/3DAYS TD PT72: 1.0000 | MEDICATED_PATCH | Freq: Once | TRANSDERMAL | Status: DC

## 2015-05-23 MED ORDER — BUPIVACAINE HCL (PF) 0.25 % IJ SOLN
INTRAMUSCULAR | Status: AC
  Filled 2015-05-23: qty 20

## 2015-05-23 MED ORDER — OXYTOCIN 10 UNIT/ML IJ SOLN
INTRAMUSCULAR | Status: AC
Start: 2015-05-23 — End: 2015-05-23
  Filled 2015-05-23: qty 4

## 2015-05-23 MED ORDER — KETOROLAC TROMETHAMINE 30 MG/ML IJ SOLN: 30.0000 mg | Freq: Four times a day (QID) | INTRAMUSCULAR | Status: AC | PRN

## 2015-05-23 MED ORDER — OXYTOCIN 40 UNITS IN LACTATED RINGERS INFUSION - SIMPLE MED: 62.5000 mL/h | INTRAVENOUS | Status: AC

## 2015-05-23 MED ORDER — SENNOSIDES-DOCUSATE SODIUM 8.6-50 MG PO TABS
2.0000 | ORAL_TABLET | ORAL | Status: DC
  Administered 2015-05-24 (×2): 2 via ORAL
  Filled 2015-05-23 (×2): qty 2

## 2015-05-23 MED ORDER — KETOROLAC TROMETHAMINE 30 MG/ML IJ SOLN
30.0000 mg | Freq: Four times a day (QID) | INTRAMUSCULAR | Status: AC | PRN
  Administered 2015-05-23: 30 mg via INTRAMUSCULAR

## 2015-05-23 MED ORDER — PHENYLEPHRINE 8 MG IN D5W 100 ML (0.08MG/ML) PREMIX OPTIME
INJECTION | INTRAVENOUS | Status: DC | PRN
  Administered 2015-05-23: 30 ug/min via INTRAVENOUS

## 2015-05-23 MED ORDER — LACTATED RINGERS IV SOLN
INTRAVENOUS | Status: DC | PRN
  Administered 2015-05-23: 11:00:00 via INTRAVENOUS

## 2015-05-23 MED ORDER — PRENATAL MULTIVITAMIN CH
1.0000 | ORAL_TABLET | Freq: Every day | ORAL | Status: DC
  Administered 2015-05-24: 1 via ORAL
  Filled 2015-05-23: qty 1

## 2015-05-23 MED ORDER — MEASLES, MUMPS & RUBELLA VAC ~~LOC~~ INJ: 0.5000 mL | INJECTION | Freq: Once | SUBCUTANEOUS | Status: DC

## 2015-05-23 MED ORDER — LACTATED RINGERS IV SOLN
Freq: Once | INTRAVENOUS | Status: AC
  Administered 2015-05-23: 09:00:00 via INTRAVENOUS

## 2015-05-23 MED ORDER — NALOXONE HCL 2 MG/2ML IJ SOSY
1.0000 ug/kg/h | PREFILLED_SYRINGE | INTRAVENOUS | Status: DC | PRN
  Filled 2015-05-23: qty 2

## 2015-05-23 MED ORDER — OXYCODONE-ACETAMINOPHEN 5-325 MG PO TABS
1.0000 | ORAL_TABLET | ORAL | Status: DC | PRN
  Administered 2015-05-24 – 2015-05-25 (×2): 1 via ORAL
  Filled 2015-05-23 (×2): qty 1

## 2015-05-23 MED ORDER — PHENYLEPHRINE 8 MG IN D5W 100 ML (0.08MG/ML) PREMIX OPTIME
INJECTION | INTRAVENOUS | Status: AC
Start: 2015-05-23 — End: 2015-05-23
  Filled 2015-05-23: qty 100

## 2015-05-23 MED ORDER — KETOROLAC TROMETHAMINE 30 MG/ML IJ SOLN
INTRAMUSCULAR | Status: AC
  Filled 2015-05-23: qty 1

## 2015-05-23 MED ORDER — SCOPOLAMINE 1 MG/3DAYS TD PT72
1.0000 | MEDICATED_PATCH | Freq: Once | TRANSDERMAL | Status: DC
  Administered 2015-05-23: 1.5 mg via TRANSDERMAL

## 2015-05-23 MED ORDER — METHYLERGONOVINE MALEATE 0.2 MG PO TABS: 0.2000 mg | ORAL_TABLET | ORAL | Status: DC | PRN

## 2015-05-23 MED ORDER — FERROUS SULFATE 325 (65 FE) MG PO TABS
325.0000 mg | ORAL_TABLET | Freq: Two times a day (BID) | ORAL | Status: DC
  Administered 2015-05-23 – 2015-05-25 (×4): 325 mg via ORAL
  Filled 2015-05-23 (×4): qty 1

## 2015-05-23 MED ORDER — DIBUCAINE 1 % RE OINT: 1.0000 "application " | TOPICAL_OINTMENT | RECTAL | Status: DC | PRN

## 2015-05-23 MED ORDER — LANOLIN HYDROUS EX OINT: 1.0000 "application " | TOPICAL_OINTMENT | CUTANEOUS | Status: DC | PRN

## 2015-05-23 MED ORDER — ONDANSETRON HCL 4 MG/2ML IJ SOLN
INTRAMUSCULAR | Status: DC | PRN
  Administered 2015-05-23: 4 mg via INTRAVENOUS

## 2015-05-23 SURGICAL SUPPLY — 45 items
APL SKNCLS STERI-STRIP NONHPOA (GAUZE/BANDAGES/DRESSINGS) ×1
BENZOIN TINCTURE PRP APPL 2/3 (GAUZE/BANDAGES/DRESSINGS) ×3 IMPLANT
BOOTIES KNEE HIGH SLOAN (MISCELLANEOUS) ×6 IMPLANT
CLAMP CORD UMBIL (MISCELLANEOUS) IMPLANT
CLOSURE STERI-STRIP 1/2X4 (GAUZE/BANDAGES/DRESSINGS) ×1
CLOSURE WOUND 1/2 X4 (GAUZE/BANDAGES/DRESSINGS) ×1
CLOTH BEACON ORANGE TIMEOUT ST (SAFETY) ×3 IMPLANT
CLSR STERI-STRIP ANTIMIC 1/2X4 (GAUZE/BANDAGES/DRESSINGS) ×1 IMPLANT
DRAIN JACKSON PRT FLT 10 (DRAIN) IMPLANT
DRAPE SHEET LG 3/4 BI-LAMINATE (DRAPES) IMPLANT
DRSG OPSITE POSTOP 4X10 (GAUZE/BANDAGES/DRESSINGS) ×3 IMPLANT
DURAPREP 26ML APPLICATOR (WOUND CARE) ×3 IMPLANT
ELECT REM PT RETURN 9FT ADLT (ELECTROSURGICAL) ×3
ELECTRODE REM PT RTRN 9FT ADLT (ELECTROSURGICAL) ×1 IMPLANT
EVACUATOR SILICONE 100CC (DRAIN) IMPLANT
EXTRACTOR VACUUM M CUP 4 TUBE (SUCTIONS) IMPLANT
EXTRACTOR VACUUM M CUP 4' TUBE (SUCTIONS)
GLOVE BIOGEL PI IND STRL 7.0 (GLOVE) ×2 IMPLANT
GLOVE BIOGEL PI INDICATOR 7.0 (GLOVE) ×4
GLOVE ECLIPSE 6.5 STRL STRAW (GLOVE) ×3 IMPLANT
GOWN STRL REUS W/TWL LRG LVL3 (GOWN DISPOSABLE) ×6 IMPLANT
KIT ABG SYR 3ML LUER SLIP (SYRINGE) IMPLANT
NDL HYPO 25X5/8 SAFETYGLIDE (NEEDLE) IMPLANT
NEEDLE HYPO 22GX1.5 SAFETY (NEEDLE) ×3 IMPLANT
NEEDLE HYPO 25X5/8 SAFETYGLIDE (NEEDLE) IMPLANT
NS IRRIG 1000ML POUR BTL (IV SOLUTION) ×6 IMPLANT
PACK C SECTION WH (CUSTOM PROCEDURE TRAY) ×3 IMPLANT
PAD ABD DERMACEA PRESS 5X9 (GAUZE/BANDAGES/DRESSINGS) ×2 IMPLANT
PAD OB MATERNITY 4.3X12.25 (PERSONAL CARE ITEMS) ×3 IMPLANT
PENCIL SMOKE EVAC W/HOLSTER (ELECTROSURGICAL) ×3 IMPLANT
RTRCTR C-SECT PINK 25CM LRG (MISCELLANEOUS) ×3 IMPLANT
SPONGE LAP 18X18 X RAY DECT (DISPOSABLE) ×2 IMPLANT
STRIP CLOSURE SKIN 1/2X4 (GAUZE/BANDAGES/DRESSINGS) ×2 IMPLANT
SUT MNCRL AB 3-0 PS2 27 (SUTURE) ×5 IMPLANT
SUT PLAIN 2 0 XLH (SUTURE) ×2 IMPLANT
SUT SILK 2 0 FSL 18 (SUTURE) IMPLANT
SUT VIC AB 0 CT1 36 (SUTURE) ×4 IMPLANT
SUT VIC AB 0 CTX 36 (SUTURE) ×15
SUT VIC AB 0 CTX36XBRD ANBCTRL (SUTURE) ×2 IMPLANT
SUT VIC AB 1 CT1 36 (SUTURE) ×6 IMPLANT
SUT VIC AB 2-0 CT1 27 (SUTURE) ×6
SUT VIC AB 2-0 CT1 TAPERPNT 27 (SUTURE) ×2 IMPLANT
SYR 20CC LL (SYRINGE) ×3 IMPLANT
TOWEL OR 17X24 6PK STRL BLUE (TOWEL DISPOSABLE) ×3 IMPLANT
TRAY FOLEY CATH SILVER 14FR (SET/KITS/TRAYS/PACK) ×3 IMPLANT

## 2015-05-23 NOTE — Lactation Note (Signed)
This note was copied from the chart of Terri Lopez. Lactation Consultation Note  Patient Name: Terri Lopez UJWJX'BToday's Date: 05/23/2015 Reason for consult: Initial assessment Mom was getting up to go the bathroom for the 1st time. RN requested that LC come back later.   Maternal Data    Feeding    LATCH Score/Interventions                      Lactation Tools Discussed/Used     Consult Status Consult Status: Follow-up Date: 05/23/15 Follow-up type: In-patient    Rulon Eisenmengerlizabeth E Donavin Audino 05/23/2015, 8:31 PM

## 2015-05-23 NOTE — Anesthesia Postprocedure Evaluation (Signed)
Anesthesia Post Note  Patient: Terri Lopez  Procedure(s) Performed: Procedure(s) (LRB): CESAREAN SECTION WITH BILATERAL TUBAL LIGATION (Bilateral)  Patient location during evaluation: PACU Anesthesia Type: Spinal Level of consciousness: awake and alert and oriented Pain management: pain level controlled Vital Signs Assessment: post-procedure vital signs reviewed and stable Respiratory status: spontaneous breathing, nonlabored ventilation and respiratory function stable Cardiovascular status: blood pressure returned to baseline and stable Postop Assessment: no signs of nausea or vomiting, no headache, no backache, patient able to bend at knees and spinal receding Anesthetic complications: no    Last Vitals:  Filed Vitals:   05/23/15 1253 05/23/15 1301  BP:  110/70  Pulse: 68 67  Temp:  36.7 C  Resp: 19 17    Last Pain: There were no vitals filed for this visit.               [name] A.

## 2015-05-23 NOTE — Anesthesia Procedure Notes (Signed)
Spinal Patient location during procedure: OR Start time: 05/23/2015 10:20 AM Staffing Anesthesiologist: Mal AmabileFOSTER, Vinessa Macconnell Performed by: anesthesiologist  Preanesthetic Checklist Completed: patient identified, site marked, surgical consent, pre-op evaluation, timeout performed, IV checked, risks and benefits discussed and monitors and equipment checked Spinal Block Patient position: sitting Prep: site prepped and draped and DuraPrep Patient monitoring: heart rate, cardiac monitor, continuous pulse ox and blood pressure Approach: midline Location: L4-5 Injection technique: single-shot Needle Needle type: Sprotte  Needle gauge: 24 G Needle length: 9 cm Needle insertion depth: 7 cm Assessment Sensory level: T4 Additional Notes Patient tolerated procedure well. Adequate sensory level.

## 2015-05-23 NOTE — Anesthesia Preprocedure Evaluation (Addendum)
Anesthesia Evaluation  Patient identified by MRN, date of birth, ID band Patient awake    Reviewed: Allergy & Precautions, NPO status , Patient's Chart, lab work & pertinent test results  Airway Mallampati: II  TM Distance: >3 FB Neck ROM: Full    Dental  (+) Teeth Intact   Pulmonary Current Smoker,    breath sounds clear to auscultation       Cardiovascular negative cardio ROS   Rhythm:Regular Rate:Normal     Neuro/Psych PSYCHIATRIC DISORDERS Anxiety negative neurological ROS     GI/Hepatic negative GI ROS, Neg liver ROS,   Endo/Other  Hypothyroidism   Renal/GU negative Renal ROS  negative genitourinary   Musculoskeletal negative musculoskeletal ROS (+)   Abdominal   Peds negative pediatric ROS (+)  Hematology negative hematology ROS (+)   Anesthesia Other Findings   Reproductive/Obstetrics (+) Pregnancy                            Lab Results  Component Value Date   WBC 12.9* 05/22/2015   HGB 11.5* 05/22/2015   HCT 34.7* 05/22/2015   MCV 100.0 05/22/2015   PLT 402* 05/22/2015   Lab Results  Component Value Date   CREATININE 0.65 01/19/2013   BUN 8 01/19/2013   NA 135 01/19/2013   K 3.3* 01/19/2013   CL 99 01/19/2013   CO2 19 01/19/2013   No results found for: INR, PROTIME   Anesthesia Physical Anesthesia Plan  ASA: III  Anesthesia Plan: Spinal   Post-op Pain Management:    Induction:   Airway Management Planned: Natural Airway  Additional Equipment:   Intra-op Plan:   Post-operative Plan:   Informed Consent: I have reviewed the patients History and Physical, chart, labs and discussed the procedure including the risks, benefits and alternatives for the proposed anesthesia with the patient or authorized representative who has indicated his/her understanding and acceptance.     Plan Discussed with: CRNA  Anesthesia Plan Comments:         Anesthesia  Quick Evaluation

## 2015-05-23 NOTE — Interval H&P Note (Signed)
History and Physical Interval Note:  05/23/2015 9:56 AM  Terri MillerPamela A Lopez  has presented today for surgery, with the diagnosis of Prior Cesarean Section, Desire for Surgical Sterilization  The various methods of treatment have been discussed with the patient and family. After consideration of risks, benefits and other options for treatment, the patient has consented to  Procedure(s): CESAREAN SECTION WITH BILATERAL TUBAL LIGATION (Bilateral) as a surgical intervention .  The patient's history has been reviewed, patient examined, no change in status, stable for surgery.  I have reviewed the patient's chart and labs.  Questions were answered to the patient's satisfaction.     Temperance Kelemen A

## 2015-05-23 NOTE — Transfer of Care (Signed)
Immediate Anesthesia Transfer of Care Note  Patient: Terri Lopez  Procedure(s) Performed: Procedure(s): CESAREAN SECTION WITH BILATERAL TUBAL LIGATION (Bilateral)  Patient Location: PACU  Anesthesia Type:Spinal  Level of Consciousness: awake, alert  and oriented  Airway & Oxygen Therapy: Patient Spontanous Breathing  Post-op Assessment: Report given to RN and Post -op Vital signs reviewed and stable  Post vital signs: Reviewed and stable  Last Vitals:  Filed Vitals:   05/23/15 0831 05/23/15 0832  BP:  131/92  Pulse: 85   Temp: 36.7 C   Resp: 20     Complications: No apparent anesthesia complications

## 2015-05-23 NOTE — Lactation Note (Signed)
This note was copied from the chart of Terri Lopez. Lactation Consultation Note  Patient Name: Terri Lopez UJWJX'BToday's Date: 05/23/2015 Reason for consult: Follow-up assessment Baby at 10 hr of life and mom is worried that she is not bf correctly. She bf her 322 yr old son for 10 m until pumping and being back to work got to be too much. Mom denies breast or nipple pain. No nipple break down noted. Mom was able to latch baby in cross cradle and football. Discussed pumping, maual expression, spoon feeding, baby behavior, feeding frequency, baby belly size, voids, wt loss, breast changes, and nipple care. Given lactation handouts. Aware of OP services and support group.    Maternal Data    Feeding Feeding Type: Breast Fed Length of feed: 15 min  LATCH Score/Interventions Latch: Repeated attempts needed to sustain latch, nipple held in mouth throughout feeding, stimulation needed to elicit sucking reflex. Intervention(s): Adjust position;Breast compression  Audible Swallowing: Spontaneous and intermittent  Type of Nipple: Everted at rest and after stimulation  Comfort (Breast/Nipple): Soft / non-tender     Hold (Positioning): Assistance needed to correctly position infant at breast and maintain latch. Intervention(s): Support Pillows;Position options  LATCH Score: 8  Lactation Tools Discussed/Used WIC Program: No   Consult Status Consult Status: Follow-up Date: 05/24/15 Follow-up type: In-patient    Rulon Eisenmengerlizabeth E Malie Kashani 05/23/2015, 9:53 PM

## 2015-05-23 NOTE — Op Note (Signed)
Preoperative diagnosis: Intrauterine pregnancy at 39 weeks with previous cesarean section, desire for sterilization  Post operative diagnosis: Same  Anesthesia: Spinal  Anesthesiologist: Dr. Malen GauzeFoster  Procedure: Repeat low transverse cesarean section with bilateral salpyngectomy  Surgeon: Dr. Dois DavenportSandra Ethyle Tiedt  Assistant: Nigel BridgemanVicki Latham CNM  Estimated blood loss: 700 cc  Procedure:  After being informed of the planned procedure and possible complications including bleeding, infection, injury to other organs, informed consent is obtained. The patient is taken to OR #9 and given spinal anesthesia without complication. She is placed in the dorsal decubitus position with the pelvis tilted to the left. She is then prepped and draped in a sterile fashion. A Foley catheter is inserted in her bladder.  After assessing adequate level of anesthesia, we infiltrate the suprapubic area with 20 cc of Marcaine 0.25 and perform a Pfannenstiel incision which is brought down sharply to the fascia. The fascia is entered in a low transverse fashion. Linea alba is dissected. Peritoneum is entered in a midline fashion. An Alexis retractor is easily positioned.   The myometrium is then entered in a low transverse fashion, 2 cm above the vesico-uterine junction ; first with knife and then extended bluntly. Amniotic fluid is clear. We assist the birth of a female  infant in vertex presentation. Mouth and nose are suctioned. The baby is delivered. The cord is clamped and sectioned. We note 2 true knots in the cord.The baby is given to the neonatologist present in the room.  10 cc of blood is drawn from the umbilical vein.The placenta is manually removed. It is complete and the cord has 3 vessels. Uterine revision is negative.  We proceed with closure of the myometrium in 2 layers: First with a running locked suture of 0 Vicryl, then with a Lembert suture of 0 Vicryl imbricating the first one. Hemostasis is completed with  cauterization on peritoneal edges and 1 figure-of-eight stitch of 0 Vicryl.  Both paracolic gutters are cleaned. Both tubes and ovaries are assessed and normal. We then proceed with bilateral salpyngectomy as follows: Marland Kitchen.  Each tube is grasped with a Babcock forceps. Using 2 Rogers clamp, we isolate the tube by clamping the mesosalpynx. The tube is sharply excised and the mesosalpynx is sutured with 2 transfixed sutures of 3-0 vicryl. Hemostasis is adequate after placement of 2 figure-of-eight stitch on the right tube.  The pelvis is profusely irrigated with warm saline to confirm a satisfactory hemostasis.  Retractors and sponges are removed. Under fascia hemostasis is completed with cauterization. The fascia is then closed with 2 running sutures of 0 Vicryl meeting midline. The wound is irrigated with warm saline and hemostasis is completed with cauterization. The skin is closed with a subcuticular suture of 3-0 Monocryl and Steri-Strips.  Instrument and sponge count is complete x2. Estimated blood loss is 700 cc.  The procedure is well tolerated by the patient who is taken to recovery room in a well and stable condition.  female baby named Lowella DellRiley Anne was born at 11:02 and received an Apgar of 8  at 1 minute and 9 at 5 minutes.    Specimen: Placenta sent to L & D   Ole Lafon A MD 12/20/20169:57 AM

## 2015-05-24 ENCOUNTER — Encounter (HOSPITAL_COMMUNITY): Payer: Self-pay | Admitting: Obstetrics and Gynecology

## 2015-05-24 LAB — CBC
HEMATOCRIT: 30.3 % — AB (ref 36.0–46.0)
HEMOGLOBIN: 10.1 g/dL — AB (ref 12.0–15.0)
MCH: 32.9 pg (ref 26.0–34.0)
MCHC: 33.3 g/dL (ref 30.0–36.0)
MCV: 98.7 fL (ref 78.0–100.0)
Platelets: 369 10*3/uL (ref 150–400)
RBC: 3.07 MIL/uL — ABNORMAL LOW (ref 3.87–5.11)
RDW: 13.8 % (ref 11.5–15.5)
WBC: 14.3 10*3/uL — AB (ref 4.0–10.5)

## 2015-05-24 LAB — BIRTH TISSUE RECOVERY COLLECTION (PLACENTA DONATION)

## 2015-05-24 NOTE — Clinical Social Work Maternal (Signed)
CLINICAL SOCIAL WORK MATERNAL/CHILD NOTE  Patient Details  Name: Terri Lopez MRN: [id number] Date of Birth: 05-01-1976  Date:  05/24/2015  Clinical Social Worker Initiating Note:  Lucita Ferrara MSW, LCSW Date/ Time Initiated:  05/24/15/1115     Child's Name:  Terri Lopez   Legal Guardian:  Heath Lark and Vickki Hearing  Need for Interpreter:  None   Date of Referral:  05/23/15     Reason for Referral: History of anxiety  Referral Source:  [facility]   Address:  [address], [place] [address]  Phone number:  [phone]   Household Members:  Minor Children, Spouse   Natural Supports (not living in the home):  Extended Family, Immediate Family   Professional Supports: None   Employment: Part-time   Type of Work: Geologist, engineering:      Museum/gallery curator Resources:  [employer]   Other Resources:  [facility]   Cultural/Religious Considerations Which May Impact Care:  None reported  Strengths:  Ability to meet basic needs , Home prepared for child , Pediatrician chosen    Risk Factors/Current Problems:   1. Mental Health Concerns: MOB presents with history of anxiety since 2007. MOB is currently prescribed Xanax, but MOB denied any need for use during the pregnancy.  MOB noted increase in anxiety/anger early in pregnancy, but stated that symptoms have since resolved.    Cognitive State:  Able to Concentrate , Alert , Goal Oriented , Linear Thinking    Mood/Affect:  Happy , Interested    CSW Assessment:  CSW received request for consult due to MOB presenting with a history of anxiety.  CSW noted in prenatal record that MOB presents with history of anxiety since 2007, and reported increase in anger and anxiety during first trimester.  CSW also noted that she is currently prescribed Xanax.  MOB presented as easily engaged and receptive to the visit. She displayed a full range in affect, presented in a pleasant mood, no acute symptoms noted or observed in  her thought process. MOB expressed feelings of happiness secondary to the infant's birth, and reported that her hopes/expectations have been met related to the childbirth experience and breastfeeding.  MOB stated that she has a 38 year old son at home, and discussed normative range of emotions associated with supporting him and helping to transition to becoming a big brother. MOB stated that she has her parents and in-laws as members of her support system, and shared goal of accepting help with this postpartum period.  She shared that it historically been difficult for her to accept help, but stated that she is realizing the benefits of accepting help in order to engage in self-care activities.   MOB reported that she is an "anxious person", but stated that it began as a result of her ex-husband. Her comments highlight level of insight as she was able to identify specific triggers and events that likely led to anxiety. MOB denied belief that anxiety negatively impacts her quality of life, and shared that it does not impact her ability to sleep or to engage in activities of daily living.  MOB denied any acute anxiety or mental health complications after her first child was born. She stated that she was previously prescribed Xanax PRN, but denied any need for Xanax in 7 months. She stated that she does not want to re-start Xanax unless needed, but shared that she is receptive to medications if necessary.  MOB reported that she has a follow up appointment with  her PCP next month, and agreed to discuss mental health concerns during this appointment if necessary.   MOB presented as receptive to education on perinatal mood disorders and self-care activities to support her mental health postpartum.  MOB denied any concerns about her current mental health.  CSW directly inquired about documentation of increase in anxiety and anger during the pregnancy. MOB shared belief that it was "situational", but stated that it was  short term and brief since she was upset with the FOB for not beginning to prepare for the infant.  CSW provided information on postpartum anger/range, and encouraged MOB to consult with her doctor if she notes ongoing anger.  MOB stated that she is not typically an angry person, and agreed to notify her medical provider if she notes return of anger and rage.  MOB denied questions, concerns, or needs at this time. She expressed appreciation for the information and support, and agreed to notify and follow up with her OB providers or PCP if she notes onset of perinatal mood disorders.  CSW Plan/Description:   1. Patient/Family Education: Perinatal mood and anxiety disorders  2. No Further Intervention Required/No Barriers to Discharge    Sharyl Nimrod 05/24/2015, 11:54 AM

## 2015-05-24 NOTE — Discharge Summary (Signed)
OB Discharge Summary  Patient Name: Terri Lopez DOB: Jun 23, 1975 MRN: [id number]  Date of admission: 05/23/2015 Delivering MD: Silverio Lay   Date of discharge: 05/24/2015  Admitting diagnosis: Prior Cesarean Section, Desire for Surgical Sterilization Intrauterine pregnancy: [redacted]w[redacted]d     Secondary diagnosis:Active Problems:   Previous cesarean section   Hypothyroidism--hx thyroidectomy due to Graves' dx.   Vitamin D deficiency   Herpes simplex   Anxiety disorder   AMA (advanced maternal age) multigravida 35+   Coxsackie viral disease--stable titers during pregnancy   H/O cryosurgery of cervix complicating pregnancy--1996   Cesarean delivery delivered  Additional problems:none     Discharge diagnosis: Term Pregnancy Delivered and see above                                                                     Post partum procedures:postpartum tubal ligation  Augmentation: N/A  Complications: None  Hospital course:  Sceduled C/S   39 y.o. yo [id number] at [redacted]w[redacted]d was admitted to the hospital 05/23/2015 for scheduled cesarean section with the following indication:Elective Repeat.  Membrane Rupture Time/Date: 11:00 AM ,05/23/2015   Patient delivered a Viable infant.05/23/2015  Details of operation can be found in separate operative note.  Pateint had an uncomplicated postpartum course.  She is ambulating, tolerating a regular diet, passing flatus, and urinating well. Patient is discharged home in stable condition on No discharge date for patient encounter.          Physical exam  Filed Vitals:   05/23/15 2029 05/24/15 0024 05/24/15 0429 05/24/15 0830  BP: 115/71 115/60 114/70 112/63  Pulse: 80 76 78 75  Temp:  98.4 F (36.9 C) 98.6 F (37 C) 98.4 F (36.9 C)  TempSrc:  Oral Oral Oral  Resp:  SpO2:  96% 96% 96%   General: alert and cooperative Lochia: appropriate Uterine Fundus: firm Incision: Dressing is clean, dry, and intact DVT Evaluation: No evidence of  DVT seen on physical exam. Labs: Lab Results  Component Value Date   WBC 14.3* 05/24/2015   HGB 10.1* 05/24/2015   HCT 30.3* 05/24/2015   MCV 98.7 05/24/2015   PLT 369 05/24/2015   CMP Latest Ref Rng 01/19/2013  Glucose 70 - 99 mg/dL 147(W)  BUN 6 - 23 mg/dL 8  Creatinine 2.95 - 6.21 mg/dL 3.08  Sodium 657 - 846 mEq/L 135  Potassium 3.5 - 5.1 mEq/L 3.3(L)  Chloride 96 - 112 mEq/L 99  CO2 19 - 32 mEq/L 19  Calcium 8.4 - 10.5 mg/dL 9.3  Total Protein 6.0 - 8.3 g/dL 7.0  Total Bilirubin 0.3 - 1.2 mg/dL 0.3  Alkaline Phos 39 - 117 U/L 90  AST 0 - 37 U/L 19  ALT 0 - 35 U/L 17    Discharge instruction: per After Visit Summary and "Baby and Me Booklet".  Medications:  Current facility-administered medications:  .  acetaminophen (TYLENOL) tablet 650 mg, 650 mg, Oral, Q4H PRN, Silverio Lay, MD, 650 mg at 05/23/15 1435 .  witch hazel-glycerin (TUCKS) pad 1 application, 1 application, Topical, PRN **AND** dibucaine (NUPERCAINAL) 1 % rectal ointment 1 application, 1 application, Rectal, PRN, Silverio Lay, MD .  diphenhydrAMINE (BENADRYL) injection 12.5 mg, 12.5 mg, Intravenous, Q4H PRN **OR** diphenhydrAMINE (BENADRYL)  capsule 25 mg, 25 mg, Oral, Q4H PRN, Shelton SilvasKevin D Hollis, MD .  diphenhydrAMINE (BENADRYL) capsule 25 mg, 25 mg, Oral, Q6H PRN, Silverio LaySandra Rivard, MD .  ferrous sulfate tablet 325 mg, 325 mg, Oral, BID WC, Silverio LaySandra Rivard, MD, 325 mg at 05/24/15 0826 .  ibuprofen (ADVIL,MOTRIN) tablet 600 mg, 600 mg, Oral, 4 times per day, Silverio LaySandra Rivard, MD, 600 mg at 05/24/15 0602 .  ketorolac (TORADOL) 30 MG/ML injection 30 mg, 30 mg, Intravenous, Q6H PRN **OR** ketorolac (TORADOL) 30 MG/ML injection 30 mg, 30 mg, Intramuscular, Q6H PRN, Shelton SilvasKevin D Hollis, MD, 30 mg at 05/23/15 1224 .  lactated ringers infusion, , Intravenous, Continuous, Silverio LaySandra Rivard, MD, Last Rate: 125 mL/hr at 05/23/15 2017 .  lanolin ointment 1 application, 1 application, Topical, PRN, Silverio LaySandra Rivard, MD .  levothyroxine  (SYNTHROID, LEVOTHROID) tablet 175 mcg, 175 mcg, Oral, QAC breakfast, Silverio LaySandra Rivard, MD, 175 mcg at 05/24/15 0826 .  menthol-cetylpyridinium (CEPACOL) lozenge 3 mg, 1 lozenge, Oral, Q2H PRN, Silverio LaySandra Rivard, MD .  methylergonovine (METHERGINE) tablet 0.2 mg, 0.2 mg, Oral, Q4H PRN **OR** methylergonovine (METHERGINE) injection 0.2 mg, 0.2 mg, Intramuscular, Q4H PRN, Silverio LaySandra Rivard, MD .  nalbuphine (NUBAIN) injection 5 mg, 5 mg, Intravenous, Q4H PRN **OR** nalbuphine (NUBAIN) injection 5 mg, 5 mg, Subcutaneous, Q4H PRN, Shelton SilvasKevin D Hollis, MD .  nalbuphine (NUBAIN) injection 5 mg, 5 mg, Intravenous, Once PRN **OR** nalbuphine (NUBAIN) injection 5 mg, 5 mg, Subcutaneous, Once PRN, Shelton SilvasKevin D Hollis, MD .  naloxone [facility](NARCAN) 2 mg in dextrose 5 % 250 mL infusion, 1-4 mcg/kg/hr, Intravenous, Continuous PRN, Shelton SilvasKevin D Hollis, MD .  naloxone [facility](NARCAN) injection 0.4 mg, 0.4 mg, Intravenous, PRN **AND** sodium chloride 0.9 % injection 3 mL, 3 mL, Intravenous, PRN, Shelton SilvasKevin D Hollis, MD .  ondansetron [facility](ZOFRAN) injection 4 mg, 4 mg, Intravenous, Q8H PRN, Shelton SilvasKevin D Hollis, MD .  oxyCODONE-acetaminophen (PERCOCET/ROXICET) 5-325 MG per tablet 1 tablet, 1 tablet, Oral, Q4H PRN, Silverio LaySandra Rivard, MD, 1 tablet at 05/24/15 0906 .  oxyCODONE-acetaminophen (PERCOCET/ROXICET) 5-325 MG per tablet 2 tablet, 2 tablet, Oral, Q4H PRN, Silverio LaySandra Rivard, MD .  prenatal multivitamin tablet 1 tablet, 1 tablet, Oral, Q1200, Silverio LaySandra Rivard, MD .  scopolamine (TRANSDERM-SCOP) 1 MG/3DAYS 1.5 mg, 1 patch, Transdermal, Once, Shelton SilvasKevin D Hollis, MD .  senna-docusate (Senokot-S) tablet 2 tablet, 2 tablet, Oral, Q24H, Silverio LaySandra Rivard, MD, 2 tablet at 05/24/15 0018 .  simethicone (MYLICON) chewable tablet 80 mg, 80 mg, Oral, TID PC, Silverio LaySandra Rivard, MD, 80 mg at 05/24/15 0827 .  simethicone (MYLICON) chewable tablet 80 mg, 80 mg, Oral, Q24H, Silverio LaySandra Rivard, MD, 80 mg at 05/24/15 0018 .  simethicone (MYLICON) chewable tablet 80 mg, 80 mg, Oral, PRN, Silverio LaySandra Rivard, MD .   zolpidem (AMBIEN) tablet 5 mg, 5 mg, Oral, QHS PRN, Silverio LaySandra Rivard, MD After Visit Meds:    Medication List    ASK your doctor about these medications        acetaminophen 500 MG tablet  Commonly known as:  TYLENOL  Take 1,000 mg by mouth every 6 (six) hours as needed for pain (For headache.).     calcium carbonate 500 MG chewable tablet  Commonly known as:  TUMS - dosed in mg elemental calcium  Chew 2-3 tablets by mouth 2 (two) times daily as needed for heartburn.     levothyroxine 175 MCG tablet  Commonly known as:  SYNTHROID, LEVOTHROID  Take 175 mcg by mouth daily before breakfast.     magnesium gluconate 500 MG tablet  Commonly known as:  MAGONATE  Take 500 mg by mouth daily as needed (For cramping.).     prenatal multivitamin Tabs tablet  Take 1 tablet by mouth daily at 12 noon.     ranitidine 75 MG tablet  Commonly known as:  ZANTAC  Take 75 mg by mouth 2 (two) times daily as needed for heartburn.     Vitamin D3 10000 UNITS capsule  Take 10,000 Units by mouth daily.        Diet: routine diet  Activity: Advance as tolerated. Pelvic rest for 6 weeks.   Outpatient follow up:6 weeks Follow up Appt:No future appointments. Follow up visit: No Follow-up on file.  Postpartum contraception: Tubal Ligation  Newborn Data: Live born female  Birth Weight: 6 lb 0.3 oz (2730 g) APGAR: 8, 9  Baby Feeding: Breast Disposition:home with mother   05/25/15 Dahlila Pfahler, CNM     Care After Cesarean Delivery  Refer to this sheet in the next few weeks. These instructions provide you with information on caring for yourself after your procedure. Your caregiver may also give you specific instructions. Your treatment has been planned according to current medical practices, but problems sometimes occur. Call your caregiver if you have any problems or questions after you go home. HOME CARE INSTRUCTIONS  Only take over-the-counter or prescription medicines as directed by  your caregiver.  Do not drink alcohol, especially if you are breastfeeding or taking medicine to relieve pain.  Do not chew or smoke tobacco.  Continue to use good perineal care. Good perineal care includes:  Wiping your perineum from front to back.  Keeping your perineum clean.  Check your cut (incision) daily for increased redness, drainage, swelling, or separation of skin.  Clean your incision gently with soap and water every day, and then pat it dry. If your caregiver says it is okay, leave the incision uncovered. Use a bandage (dressing) if the incision is draining fluid or appears irritated. If the adhesive strips across the incision do not fall off within 7 days, carefully peel them off.  Hug a pillow when coughing or sneezing until your incision is healed. This helps to relieve pain.  Do not use tampons or douche until your caregiver says it is okay.  Shower, wash your hair, and take tub baths as directed by your caregiver.  Wear a well-fitting bra that provides breast support.  Limit wearing support panties or control-top hose.  Drink enough fluids to keep your urine clear or pale yellow.  Eat high-fiber foods such as whole grain cereals and breads, brown rice, beans, and fresh fruits and vegetables every day. These foods may help prevent or relieve constipation.  Resume activities such as climbing stairs, driving, lifting, exercising, or traveling as directed by your caregiver.  Talk to your caregiver about resuming sexual activities. This is dependent upon your risk of infection, your rate of healing, and your comfort and desire to resume sexual activity.  Try to have someone help you with your household activities and your newborn for at least a few days after you leave the hospital.  Rest as much as possible. Try to rest or take a nap when your newborn is sleeping.  Increase your activities gradually.  Keep all of your scheduled postpartum appointments. It is very  important to keep your scheduled follow-up appointments. At these appointments, your caregiver will be checking to make sure that you are healing physically and emotionally. SEEK MEDICAL CARE IF:   You are passing  large clots from your vagina. Save any clots to show your caregiver.  You have a foul smelling discharge from your vagina.  You have trouble urinating.  You are urinating frequently.  You have pain when you urinate.  You have a change in your bowel movements.  You have increasing redness, pain, or swelling near your incision.  You have pus draining from your incision.  Your incision is separating.  You have painful, hard, or reddened breasts.  You have a severe headache.  You have blurred vision or see spots.  You feel sad or depressed.  You have thoughts of hurting yourself or your newborn.  You have questions about your care, the care of your newborn, or medicines.  You are dizzy or lightheaded.  You have a rash.  You have pain, redness, or swelling at the site of the removed intravenous access (IV) tube.  You have nausea or vomiting.  You stopped breastfeeding and have not had a menstrual period within 12 weeks of stopping.  You are not breastfeeding and have not had a menstrual period within 12 weeks of delivery.  You have a fever. SEEK IMMEDIATE MEDICAL CARE IF:  You have persistent pain.  You have chest pain.  You have shortness of breath.  You faint.  You have leg pain.  You have stomach pain.  Your vaginal bleeding saturates 2 or more sanitary pads in 1 hour. MAKE SURE YOU:   Understand these instructions.  Will watch your condition.  Will get help right away if you are not doing well or get worse. Document Released: 02/09/2002 Document Revised: 02/12/2012 Document Reviewed: 01/15/2012 [facility] Patient Information 2014 [place], [place].   Postpartum Depression and Baby Blues  The postpartum period begins right after the birth  of a baby. During this time, there is often a great amount of joy and excitement. It is also a time of considerable changes in the life of the parent(s). Regardless of how many times a mother gives birth, each child brings new challenges and dynamics to the family. It is not unusual to have feelings of excitement accompanied by confusing shifts in moods, emotions, and thoughts. All mothers are at risk of developing postpartum depression or the "baby blues." These mood changes can occur right after giving birth, or they may occur many months after giving birth. The baby blues or postpartum depression can be mild or severe. Additionally, postpartum depression can resolve rather quickly, or it can be a long-term condition. CAUSES Elevated hormones and their rapid decline are thought to be a main cause of postpartum depression and the baby blues. There are a number of hormones that radically change during and after pregnancy. Estrogen and progesterone usually decrease immediately after delivering your baby. The level of thyroid hormone and various cortisol steroids also rapidly drop. Other factors that play a major role in these changes include major life events and genetics.  RISK FACTORS If you have any of the following risks for the baby blues or postpartum depression, know what symptoms to watch out for during the postpartum period. Risk factors that may increase the likelihood of getting the baby blues or postpartum depression include: 1. Havinga personal or family history of depression. 2. Having depression while being pregnant. 3. Having premenstrual or oral contraceptive-associated mood issues. 4. Having exceptional life stress. 5. Having marital conflict. 6. Lacking a social support network. 7. Having a baby with special needs. 8. Having health problems such as diabetes. SYMPTOMS Baby blues symptoms include:  Brief fluctuations in mood, such as going from extreme happiness to  sadness.  Decreased concentration.  Difficulty sleeping.  Crying spells, tearfulness.  Irritability.  Anxiety. Postpartum depression symptoms typically begin within the first month after giving birth. These symptoms include:  Difficulty sleeping or excessive sleepiness.  Marked weight loss.  Agitation.  Feelings of worthlessness.  Lack of interest in activity or food. Postpartum psychosis is a very concerning condition and can be dangerous. Fortunately, it is rare. Displaying any of the following symptoms is cause for immediate medical attention. Postpartum psychosis symptoms include:  Hallucinations and delusions.  Bizarre or disorganized behavior.  Confusion or disorientation. DIAGNOSIS  A diagnosis is made by an evaluation of your symptoms. There are no medical or lab tests that lead to a diagnosis, but there are various questionnaires that a caregiver may use to identify those with the baby blues, postpartum depression, or psychosis. Often times, a screening tool called the [place] Postnatal Depression Scale is used to diagnose depression in the postpartum period.  TREATMENT The baby blues usually goes away on its own in 1 to 2 weeks. Social support is often all that is needed. You should be encouraged to get adequate sleep and rest. Occasionally, you may be given medicines to help you sleep.  Postpartum depression requires treatment as it can last several months or longer if it is not treated. Treatment may include individual or group therapy, medicine, or both to address any social, physiological, and psychological factors that may play a role in the depression. Regular exercise, a healthy diet, rest, and social support may also be strongly recommended.  Postpartum psychosis is more serious and needs treatment right away. Hospitalization is often needed. HOME CARE INSTRUCTIONS  Get as much rest as you can. Nap when the baby sleeps.  Exercise regularly. Some women find  yoga and walking to be beneficial.  Eat a balanced and nourishing diet.  Do little things that you enjoy. Have a cup of tea, take a bubble bath, read your favorite magazine, or listen to your favorite music.  Avoid alcohol.  Ask for help with household chores, cooking, grocery shopping, or running errands as needed. Do not try to do everything.  Talk to people close to you about how you are feeling. Get support from your partner, family members, friends, or other new moms.  Try to stay positive in how you think. Think about the things you are grateful for.  Do not spend a lot of time alone.  Only take medicine as directed by your caregiver.  Keep all your postpartum appointments.  Let your caregiver know if you have any concerns. SEEK MEDICAL CARE IF: You are having a reaction or problems with your medicine. SEEK IMMEDIATE MEDICAL CARE IF:  You have suicidal feelings.  You feel you may harm the baby or someone else. Document Released: 02/22/2004 Document Revised: 08/12/2011 Document Reviewed: 03/26/2011 [facility] Patient Information 2014 [place], [place].   Breastfeeding Deciding to breastfeed is one of the best choices you can make for you and your baby. A change in hormones during pregnancy causes your breast tissue to grow and increases the number and size of your milk ducts. These hormones also allow proteins, sugars, and fats from your blood supply to make breast milk in your milk-producing glands. Hormones prevent breast milk from being released before your baby is born as well as prompt milk flow after birth. Once breastfeeding has begun, thoughts of your baby, as well as his or her sucking  or crying, can stimulate the release of milk from your milk-producing glands.  BENEFITS OF BREASTFEEDING For Your Baby  Your first milk (colostrum) helps your baby's digestive system function better.   There are antibodies in your milk that help your baby fight off infections.    Your baby has a lower incidence of asthma, allergies, and sudden infant death syndrome.   The nutrients in breast milk are better for your baby than infant formulas and are designed uniquely for your baby's needs.   Breast milk improves your baby's brain development.   Your baby is less likely to develop other conditions, such as childhood obesity, asthma, or type 2 diabetes mellitus.  For You   Breastfeeding helps to create a very special bond between you and your baby.   Breastfeeding is convenient. Breast milk is always available at the correct temperature and costs nothing.   Breastfeeding helps to burn calories and helps you lose the weight gained during pregnancy.   Breastfeeding makes your uterus contract to its prepregnancy size faster and slows bleeding (lochia) after you give birth.   Breastfeeding helps to lower your risk of developing type 2 diabetes mellitus, osteoporosis, and breast or ovarian cancer later in life. SIGNS THAT YOUR BABY IS HUNGRY Early Signs of Hunger  Increased alertness or activity.  Stretching.  Movement of the head from side to side.  Movement of the head and opening of the mouth when the corner of the mouth or cheek is stroked (rooting).  Increased sucking sounds, smacking lips, cooing, sighing, or squeaking.  Hand-to-mouth movements.  Increased sucking of fingers or hands. Late Signs of Hunger  Fussing.  Intermittent crying. Extreme Signs of Hunger Signs of extreme hunger will require calming and consoling before your baby will be able to breastfeed successfully. Do not wait for the following signs of extreme hunger to occur before you initiate breastfeeding:   Restlessness.  A loud, strong cry.   Screaming.  BREASTFEEDING BASICS Breastfeeding Initiation  Find a comfortable place to sit or lie down, with your neck and back well supported.  Place a pillow or rolled up blanket under your baby to bring him or her to  the level of your breast (if you are seated). Nursing pillows are specially designed to help support your arms and your baby while you breastfeed.  Make sure that your baby's abdomen is facing your abdomen.   Gently massage your breast. With your fingertips, massage from your chest wall toward your nipple in a circular motion. This encourages milk flow. You may need to continue this action during the feeding if your milk flows slowly.  Support your breast with 4 fingers underneath and your thumb above your nipple. Make sure your fingers are well away from your nipple and your baby's mouth.   Stroke your baby's lips gently with your finger or nipple.   When your baby's mouth is open wide enough, quickly bring your baby to your breast, placing your entire nipple and as much of the colored area around your nipple (areola) as possible into your baby's mouth.   More areola should be visible above your baby's upper lip than below the lower lip.   Your baby's tongue should be between his or her lower gum and your breast.   Ensure that your baby's mouth is correctly positioned around your nipple (latched). Your baby's lips should create a seal on your breast and be turned out (everted).  It is common for your baby to suck  about 2-3 minutes in order to start the flow of breast milk. Latching Teaching your baby how to latch on to your breast properly is very important. An improper latch can cause nipple pain and decreased milk supply for you and poor weight gain in your baby. Also, if your baby is not latched onto your nipple properly, he or she may swallow some air during feeding. This can make your baby fussy. Burping your baby when you switch breasts during the feeding can help to get rid of the air. However, teaching your baby to latch on properly is still the best way to prevent fussiness from swallowing air while breastfeeding. Signs that your baby has successfully latched on to your nipple:     Silent tugging or silent sucking, without causing you pain.   Swallowing heard between every 3-4 sucks.    Muscle movement above and in front of his or her ears while sucking.  Signs that your baby has not successfully latched on to nipple:   Sucking sounds or smacking sounds from your baby while breastfeeding.  Nipple pain. If you think your baby has not latched on correctly, slip your finger into the corner of your baby's mouth to break the suction and place it between your baby's gums. Attempt breastfeeding initiation again. Signs of Successful Breastfeeding Signs from your baby:   A gradual decrease in the number of sucks or complete cessation of sucking.   Falling asleep.   Relaxation of his or her body.   Retention of a small amount of milk in his or her mouth.   Letting go of your breast by himself or herself. Signs from you:  Breasts that have increased in firmness, weight, and size 1-3 hours after feeding.   Breasts that are softer immediately after breastfeeding.  Increased milk volume, as well as a change in milk consistency and color by the fifth day of breastfeeding.   Nipples that are not sore, cracked, or bleeding. Signs That Your Pecola Leisure is Getting Enough Milk  Wetting at least 3 diapers in a 24-hour period. The urine should be clear and pale yellow by age 54701 days.  At least 3 stools in a 24-hour period by age 54701 days. The stool should be soft and yellow.  At least 3 stools in a 24-hour period by age 63 days. The stool should be seedy and yellow.  No loss of weight greater than 10% of birth weight during the first 65 days of age.  Average weight gain of 4-7 ounces (113-198 g) per week after age 547 days.  Consistent daily weight gain by age 54701 days, without weight loss after the age of 2 weeks. After a feeding, your baby may spit up a small amount. This is common. BREASTFEEDING FREQUENCY AND DURATION Frequent feeding will help you make more milk and  can prevent sore nipples and breast engorgement. Breastfeed when you feel the need to reduce the fullness of your breasts or when your baby shows signs of hunger. This is called "breastfeeding on demand." Avoid introducing a pacifier to your baby while you are working to establish breastfeeding (the first 4-6 weeks after your baby is born). After this time you may choose to use a pacifier. Research has shown that pacifier use during the first year of a baby's life decreases the risk of sudden infant death syndrome (SIDS). Allow your baby to feed on each breast as long as he or she wants. Breastfeed until your baby is finished feeding. When  your baby unlatches or falls asleep while feeding from the first breast, offer the second breast. Because newborns are often sleepy in the first few weeks of life, you may need to awaken your baby to get him or her to feed. Breastfeeding times will vary from baby to baby. However, the following rules can serve as a guide to help you ensure that your baby is properly fed:  Newborns (babies 40 weeks of age or younger) may breastfeed every 1-3 hours.  Newborns should not go longer than 3 hours during the day or 5 hours during the night without breastfeeding.  You should breastfeed your baby a minimum of 8 times in a 24-hour period until you begin to introduce solid foods to your baby at around 11 months of age. BREAST MILK PUMPING Pumping and storing breast milk allows you to ensure that your baby is exclusively fed your breast milk, even at times when you are unable to breastfeed. This is especially important if you are going back to work while you are still breastfeeding or when you are not able to be present during feedings. Your lactation consultant can give you guidelines on how long it is safe to store breast milk.  A breast pump is a machine that allows you to pump milk from your breast into a sterile bottle. The pumped breast milk can then be stored in a refrigerator  or freezer. Some breast pumps are operated by hand, while others use electricity. Ask your lactation consultant which type will work best for you. Breast pumps can be purchased, but some hospitals and breastfeeding support groups lease breast pumps on a monthly basis. A lactation consultant can teach you how to hand express breast milk, if you prefer not to use a pump.  CARING FOR YOUR BREASTS WHILE YOU BREASTFEED Nipples can become dry, cracked, and sore while breastfeeding. The following recommendations can help keep your breasts moisturized and healthy:  Avoid using soap on your nipples.   Wear a supportive bra. Although not required, special nursing bras and tank tops are designed to allow access to your breasts for breastfeeding without taking off your entire bra or top. Avoid wearing underwire-style bras or extremely tight bras.  Air dry your nipples for 3-15minutes after each feeding.   Use only cotton bra pads to absorb leaked breast milk. Leaking of breast milk between feedings is normal.   Use lanolin on your nipples after breastfeeding. Lanolin helps to maintain your skin's normal moisture barrier. If you use pure lanolin, you do not need to wash it off before feeding your baby again. Pure lanolin is not toxic to your baby. You may also hand express a few drops of breast milk and gently massage that milk into your nipples and allow the milk to air dry. In the first few weeks after giving birth, some women experience extremely full breasts (engorgement). Engorgement can make your breasts feel heavy, warm, and tender to the touch. Engorgement peaks within 3-5 days after you give birth. The following recommendations can help ease engorgement:  Completely empty your breasts while breastfeeding or pumping. You may want to start by applying warm, moist heat (in the shower or with warm water-soaked hand towels) just before feeding or pumping. This increases circulation and helps the milk flow.  If your baby does not completely empty your breasts while breastfeeding, pump any extra milk after he or she is finished.  Wear a snug bra (nursing or regular) or tank top for 1-2 days  to signal your body to slightly decrease milk production.  Apply ice packs to your breasts, unless this is too uncomfortable for you.  Make sure that your baby is latched on and positioned properly while breastfeeding. If engorgement persists after 48 hours of following these recommendations, contact your health care provider or a Advertising copywriter. OVERALL HEALTH CARE RECOMMENDATIONS WHILE BREASTFEEDING  Eat healthy foods. Alternate between meals and snacks, eating 3 of each per day. Because what you eat affects your breast milk, some of the foods may make your baby more irritable than usual. Avoid eating these foods if you are sure that they are negatively affecting your baby.  Drink milk, fruit juice, and water to satisfy your thirst (about 10 glasses a day).   Rest often, relax, and continue to take your prenatal vitamins to prevent fatigue, stress, and anemia.  Continue breast self-awareness checks.  Avoid chewing and smoking tobacco.  Avoid alcohol and drug use. Some medicines that may be harmful to your baby can pass through breast milk. It is important to ask your health care provider before taking any medicine, including all over-the-counter and prescription medicine as well as vitamin and herbal supplements. It is possible to become pregnant while breastfeeding. If birth control is desired, ask your health care provider about options that will be safe for your baby. SEEK MEDICAL CARE IF:   You feel like you want to stop breastfeeding or have become frustrated with breastfeeding.  You have painful breasts or nipples.  Your nipples are cracked or bleeding.  Your breasts are red, tender, or warm.  You have a swollen area on either breast.  You have a fever or chills.  You have nausea or  vomiting.  You have drainage other than breast milk from your nipples.  Your breasts do not become full before feedings by the fifth day after you give birth.  You feel sad and depressed.  Your baby is too sleepy to eat well.  Your baby is having trouble sleeping.   Your baby is wetting less than 3 diapers in a 24-hour period.  Your baby has less than 3 stools in a 24-hour period.  Your baby's skin or the white part of his or her eyes becomes yellow.   Your baby is not gaining weight by 64 days of age. SEEK IMMEDIATE MEDICAL CARE IF:   Your baby is overly tired (lethargic) and does not want to wake up and feed.  Your baby develops an unexplained fever. Document Released: 05/20/2005 Document Revised: 05/25/2013 Document Reviewed: 11/11/2012 [facility] Patient Information 2015 [place], [place]. This information is not intended to replace advice given to you by your health care provider. Make sure you discuss any questions you have with your health care provider.

## 2015-05-24 NOTE — Progress Notes (Signed)
Subjective: Postpartum Day 1: Cesarean Delivery due to repeat with BTL Patient up ad lib, reports no syncope or dizziness. +Flatus, Feeding:  breast Contraceptive plan:  BTL  Objective: Vital signs in last 24 hours: Temp:  [97.8 F (36.6 C)-99.1 F (37.3 C)] 98.4 F (36.9 C) (12/21 0830) Pulse Rate:  [64-88] 75 (12/21 0830) Resp:  [16-21] 16 (12/21 0830) BP: (100-128)/(57-97) 112/63 mmHg (12/21 0830) SpO2:  [95 %-98 %] 96 % (12/21 0830)  Physical Exam:  General: alert and cooperative Lochia: appropriate Uterine Fundus: firm Abdomen:  + bowel sounds, non distended Incision: no significant drainage  Pressure dressing CDI DVT Evaluation: No evidence of DVT seen on physical exam. Homan's sign: Negative   Recent Labs  05/22/15 0913 05/24/15 0610  HGB 11.5* 10.1*  HCT 34.7* 30.3*  WBC 12.9* 14.3*   Orthostatics stable.  Assessment: Status post Cesarean section day 1. Doing well postoperatively.  Honeycomb dressing in place, no significant drainage Anemia - hemodynamicly stable.     Plan: Continue current care. Breastfeeding and Lactation consult  Possible DC tomorrow Remove outer dressing   Areej Tayler, CNM, MSN 05/24/2015. 10:06 AM

## 2015-05-25 MED ORDER — FERROUS SULFATE 325 (65 FE) MG PO TABS: 325.0000 mg | ORAL_TABLET | Freq: Two times a day (BID) | ORAL | Status: DC

## 2015-05-25 MED ORDER — IBUPROFEN 600 MG PO TABS: 600.0000 mg | ORAL_TABLET | Freq: Four times a day (QID) | ORAL | Status: DC

## 2015-05-25 MED ORDER — SENNOSIDES-DOCUSATE SODIUM 8.6-50 MG PO TABS: 2.0000 | ORAL_TABLET | ORAL | Status: DC

## 2015-05-25 MED ORDER — OXYCODONE-ACETAMINOPHEN 5-325 MG PO TABS: 1.0000 | ORAL_TABLET | ORAL | Status: DC | PRN

## 2015-05-25 NOTE — Lactation Note (Signed)
This note was copied from the chart of Girl Tamela Gammonamela Warren. Lactation Consultation Note  Patient Name: Girl Tamela Gammonamela Warren FAOZH'YToday's Date: 05/25/2015  Visited with Mom on day of discharge, baby 2548 hrs old.  Baby in Mom's arms, sucking on pacifier.  Discussed importance of limiting pacifier use in first few weeks.  Encouraged her to put baby on the breast.  She said she just cluster fed baby and was trying to take a break.  Mom describes no discomfort with breast feeding, and feels her milk is coming in.  Baby's stools are turning green and seedy per Mom.  Talked about engorgement prevention and treatment, including no pacifier use.  Encouraged skin to skin, and cue based feedings.  Reminded Mom of OP lactation resources available to her.  Encourage her to call prn for guidance.  Judee ClaraSmith, Claritza July E 05/25/2015, 11:12 AM

## 2015-07-03 ENCOUNTER — Ambulatory Visit: Payer: Medicaid Other | Attending: Gynecologic Oncology | Admitting: Gynecologic Oncology

## 2015-07-03 ENCOUNTER — Encounter: Payer: Self-pay | Admitting: Gynecologic Oncology

## 2015-07-03 VITALS — BP 117/81 | HR 93 | Temp 97.7°F | Resp 18 | Ht 62.0 in | Wt 193.0 lb

## 2015-07-03 DIAGNOSIS — N838 Other noninflammatory disorders of ovary, fallopian tube and broad ligament: Secondary | ICD-10-CM | POA: Insufficient documentation

## 2015-07-03 NOTE — Patient Instructions (Signed)
You will receive a phone call from the Cancer Center with an appointment to meet with the Genetics Counselor.  Her office is in the Cancer Center on the second floor in the Support Services department.  Please call for any questions or concerns.

## 2015-07-03 NOTE — Progress Notes (Signed)
Consult Note: Gyn-Onc  Consult was requested by Dr. Cletis Media for the evaluation of Terri Lopez 40 y.o. female  CC:  Chief Complaint  Patient presents with  . Hyper plasia fallopian tube    New Consultation    Assessment/Plan:  [name]  is a 40 y.o.  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 She does not have a family history concerning for hereditary predisposition to malignancy.  I discussed with the patient that there is not good literature or guidance as to how to follow patients after this incidental finding. I explained the histologic nature of this disease. I discussed that it is a preinvasive nonmalignant process. I did discuss that we believe it is a precursor for malignancy. There is a small theoretic risk that shed of the hyperplastic cells could've occurred prior to salpingectomy, and certainly there have been rare reports in the literature of subsequent development of primary peritoneal carcinoma after review of fallopian tubes at prior surgeries had revealed epithelial hyperplasia. However this is a rare occurrence that we do not recommend serial screening/imaging/labs to monitor for potential development of perineal malignancy.  We'll would be of benefit in this patient is to fully understand her underlying risk for ovarian cancer. For this reason I am having her seen by our genetic counselors for consideration of BRCA testing. If she is found to be a carrier of a deleterious mutation in the BRCA spectrum she would be a candidate for completion I lateral nephrectomy. Otherwise, given her young age and in no known family history, she would have greater benefit in retaining her ovaries at this time.  HPI: Terri Lopez is a very pleasant 40 year old G2 P2 who is seen in consultation at the request of Dr. Cletis Media for focal papillary  epithelial hyperplasia of the left fallopian tube. The patient is otherwise very healthy postpartum woman. She underwent a scheduled cesarean section on 05/23/2015 at full term. This was associated with bilateral salpingectomy for sterilization. No abnormalities were noted on the ovaries or tubes at the time of surgery. The final pathology from her surgery revealed a left fallopian tube containing a focal papillary epithelial hyperplasia. It was in the fimbria of the left fallopian tube. Was a microscopic focus. There was mild associated nuclear pleomorphism. There was no solid growth or invasion of the underlying fallopian tube stroma.  She is otherwise very healthy, she has no significant family history of malignancy other than a paternal grandfather with what may have been colon cancer later in age.   Current Meds:  Outpatient Encounter Prescriptions as of 07/03/2015  Medication Sig  . ferrous sulfate 325 (65 FE) MG tablet Take 1 tablet (325 mg total) by mouth 2 (two) times daily with a meal.  . levothyroxine (SYNTHROID, LEVOTHROID) 175 MCG tablet Take 200 mcg by mouth daily before breakfast.   . Prenatal Vit-Fe Fumarate-FA (PRENATAL MULTIVITAMIN) TABS tablet Take 1 tablet by mouth daily at 12 noon.  Marland Kitchen ibuprofen (ADVIL,MOTRIN) 600 MG tablet Take 1 tablet (600 mg total) by mouth every 6 (six) hours. (Patient not taking: Reported on 07/03/2015)  . senna-docusate (SENOKOT-S) 8.6-50 MG tablet Take 2 tablets by mouth daily. (Patient not taking: Reported on 07/03/2015)  . [DISCONTINUED] oxyCODONE-acetaminophen (PERCOCET/ROXICET) 5-325 MG tablet Take 1 tablet by mouth every 4 (four) hours as needed (for pain scale 4-7).   No facility-administered encounter medications on file as  of 07/03/2015.    Allergy: No Known Allergies  Social Hx:   Social History   Social History  . Marital Status: Married    Spouse Name: N/A  . Number of Children: N/A  . Years of Education: N/A   Occupational History  .  Not on file.   Social History Main Topics  . Smoking status: Former Smoker -- 0.50 packs/day    Types: Cigarettes    Quit date: 02/22/2015  . Smokeless tobacco: Never Used  . Alcohol Use: Yes     Comment: rarely  . Drug Use: No  . Sexual Activity: Yes    Birth Control/ Protection: None   Other Topics Concern  . Not on file   Social History Narrative    Past Surgical Hx:  Past Surgical History  Procedure Laterality Date  . Thyroid surgery  2006  . Cesarean section N/A 01/21/2013    Procedure: CESAREAN SECTION;  Surgeon: Luz Lex, MD;  Location: [facility];  Service: Obstetrics;  Laterality: N/A;  . Cesarean section with bilateral tubal ligation Bilateral 05/23/2015    Procedure: CESAREAN SECTION WITH BILATERAL TUBAL LIGATION;  Surgeon: Delsa Bern, MD;  Location: [facility];  Service: Obstetrics;  Laterality: Bilateral;    Past Medical Hx:  Past Medical History  Diagnosis Date  . Panic attacks   . Medical history non-contributory     Past Gynecological History:  C/s x 2  No LMP recorded.  Family Hx:  Family History  Problem Relation Age of Onset  . Diabetes Mother     type II    Review of Systems:  Constitutional  Feels well,    ENT Normal appearing ears and nares bilaterally Skin/Breast  No rash, sores, jaundice, itching, dryness Cardiovascular  No chest pain, shortness of breath, or edema  Pulmonary  No cough or wheeze.  Gastro Intestinal  No nausea, vomitting, or diarrhoea. No bright red blood per rectum, no abdominal pain, change in bowel movement, or constipation.  Genito Urinary  No frequency, urgency, dysuria,  Musculo Skeletal  No myalgia, arthralgia, joint swelling or pain  Neurologic  No weakness, numbness, change in gait,  Psychology  No depression, anxiety, insomnia.   Vitals:  Blood pressure 117/81, pulse 93, temperature 97.7 F (36.5 C), temperature source Oral, resp. rate 18, height '5\' 2"'$  (1.575 m), weight 193 lb (87.544 kg), SpO2 98 %,  unknown if currently breastfeeding.  Physical Exam: deferred   Donaciano Eva, MD  07/03/2015, 5:14 PM

## 2015-07-06 ENCOUNTER — Telehealth: Payer: Self-pay | Admitting: Genetic Counselor

## 2015-07-06 NOTE — Telephone Encounter (Signed)
PT CONFIRMED APPT FOR 2/22 AT 10AM

## 2015-07-26 ENCOUNTER — Other Ambulatory Visit: Payer: Medicaid Other

## 2015-07-26 ENCOUNTER — Encounter: Payer: Medicaid Other | Admitting: Genetic Counselor

## 2015-08-04 ENCOUNTER — Telehealth: Payer: Self-pay | Admitting: Genetic Counselor

## 2015-08-04 NOTE — Telephone Encounter (Signed)
[name] had previously cancelled her genetic counseling appointment and is calling to reschedule.  Mondays and Wednesdays at 10 AM work better for her.  Gave her the next available slot for these days--Wednesday, March 29th at 10 AM.

## 2015-08-30 ENCOUNTER — Telehealth: Payer: Self-pay | Admitting: Genetic Counselor

## 2015-08-30 ENCOUNTER — Encounter: Payer: Self-pay | Admitting: Genetic Counselor

## 2015-08-30 ENCOUNTER — Other Ambulatory Visit: Payer: Medicaid Other

## 2015-08-30 ENCOUNTER — Ambulatory Visit (HOSPITAL_BASED_OUTPATIENT_CLINIC_OR_DEPARTMENT_OTHER): Payer: Medicaid Other | Admitting: Genetic Counselor

## 2015-08-30 DIAGNOSIS — Z803 Family history of malignant neoplasm of breast: Secondary | ICD-10-CM | POA: Diagnosis not present

## 2015-08-30 DIAGNOSIS — Z8041 Family history of malignant neoplasm of ovary: Secondary | ICD-10-CM | POA: Insufficient documentation

## 2015-08-30 DIAGNOSIS — N838 Other noninflammatory disorders of ovary, fallopian tube and broad ligament: Secondary | ICD-10-CM

## 2015-08-30 NOTE — Progress Notes (Signed)
REFERRING PROVIDER: Enid Skeens, MD [phone], [place] [address]   Everitt Amber, MD  PRIMARY PROVIDER:  Enid Skeens., MD  PRIMARY REASON FOR VISIT:  1. Atypical hyperplasia of fallopian tube   2. Family history of ovarian cancer   3. Family history of breast cancer      HISTORY OF PRESENT ILLNESS:   [name], a 40 y.o. female, was seen for a [facility] cancer genetics consultation at the request of Dr. Denman George due to a family history of cancer and a personal history of atypical hyperplasia of the fallopian tube.  [name] presents to clinic today to discuss the possibility of a hereditary predisposition to cancer, genetic testing, and to further clarify her future cancer risks, as well as potential cancer risks for family members. [name] is a 40 y.o. female with no personal history of cancer.    CANCER HISTORY:   No history exists.     HORMONAL RISK FACTORS:  Menarche was at age 57.  First live birth at age 33.  OCP use for approximately 5 years.  Ovaries intact: yes.  Hysterectomy: no.  Menopausal status: premenopausal.  HRT use: 0 years. Colonoscopy: no; not examined. Mammogram within the last year: no. Number of breast biopsies: 2. Up to date with pelvic exams:  yes. Any excessive radiation exposure in the past:  no  Past Medical History  Diagnosis Date  . Panic attacks   . Medical history non-contributory   . Family history of ovarian cancer   . Family history of breast cancer     Past Surgical History  Procedure Laterality Date  . Thyroid surgery  2006  . Cesarean section N/A 01/21/2013    Procedure: CESAREAN SECTION;  Surgeon: Luz Lex, MD;  Location: [facility];  Service: Obstetrics;  Laterality: N/A;  . Cesarean section with bilateral tubal ligation Bilateral 05/23/2015    Procedure: CESAREAN SECTION WITH BILATERAL TUBAL LIGATION;  Surgeon: Delsa Bern, MD;  Location: [facility];  Service: Obstetrics;  Laterality: Bilateral;     Social History   Social History  . Marital Status: Married    Spouse Name: N/A  . Number of Children: 2  . Years of Education: N/A   Social History Main Topics  . Smoking status: Former Smoker -- 0.50 packs/day    Types: Cigarettes    Quit date: 02/22/2015  . Smokeless tobacco: Never Used  . Alcohol Use: Yes     Comment: rarely  . Drug Use: No  . Sexual Activity: Yes    Birth Control/ Protection: None   Other Topics Concern  . None   Social History Narrative     FAMILY HISTORY:  We obtained a detailed, 4-generation family history.  Significant diagnoses are listed below: Family History  Problem Relation Age of Onset  . Diabetes Mother     type II  . Colon polyps Mother   . Aneurysm Maternal Aunt 30    brain  . Alcohol abuse Maternal Uncle   . Ovarian cancer Maternal Grandmother   . Alzheimer's disease Maternal Grandmother   . Colon cancer Paternal Grandfather     dx in his 26s-70s  . Breast cancer Maternal Aunt     mother's maternal 1/2 sister    The patient has two children who are healthy and cancer free.  She has one maternal half brother and one paternal half brother who are both healthy and cancer free.  Her mother has had colon polyps on  her colonoscopy, but the patient is unsure of how many.  She reports that her father is healthy.  The patient's mother has one full brother and full sister.  The sister died of a brain anueysym in her 75sa nd the brother is estranged due to alcohol problems.  She has several half siblings.  A maternal half sister had breast cancer at an unknown age.  The patient's maternal grandmother had ovarian cancer, reportedly under the age of 23.  She currently has Alzheimer's disease.  Patient's maternal ancestors are of [place] descent, and paternal ancestors are of [place], [place] and [place] descent. There is no reported Ashkenazi Jewish ancestry. There is no known consanguinity.  GENETIC COUNSELING ASSESSMENT: [name] is a 40  y.o. female with a family history of breast and ovarian cancer and a personal history of atypical hyperplasia of the fallopian tube which is somewhat suggestive of a hereditary cancer syndrome and predisposition to cancer. We, therefore, discussed and recommended the following at today's visit.   DISCUSSION: We discussed that about 5-10% of breast cancer, and 15-20% of ovarian cancer is due to hereditary cancer syndromes, most commonly with BRCA mutations.  Ovarian cancer is also seen frequently in Lynch syndrome.  We reviewed the characteristics, features and inheritance patterns of hereditary cancer syndromes. We also discussed genetic testing, including the appropriate family members to test, the process of testing, insurance coverage and turn-around-time for results. We discussed the implications of a negative, positive and/or variant of uncertain significant result. We recommended Ms. Broadus John pursue genetic testing for the HEreditary cancer gene panel through [facility].   Based on [name]'s family history of cancer, she meets medical criteria for genetic testing. Despite that she meets criteria, she may still have an out of pocket cost. We discussed that if her out of pocket cost for testing is over $100, the laboratory will call and confirm whether she wants to proceed with testing.  If the out of pocket cost of testing is less than $100 she will be billed by the genetic testing laboratory.   In order to estimate her chance of having a BRCA mutation, we used statistical models (Tyrer Cusik, [place]) and laboratory data that take into account her personal medical history, family history and ancestry.  Because each model is different, there can be a lot of variability in the risks they give.  Therefore, these numbers must be considered a rough range and not a precise risk of having a BRCA mutation.  These models estimate that she has approximately a 1.35 - 2% chance of having a mutation. Based on this  assessment of her family and personal history, genetic testing is recommended.  Based on the patient's personal and family history, statistical models (Tyrer Cusik)  and literature data were used to estimate her risk of developing breast cancer. These estimate her lifetime risk of developing breast cancer to be approximately 35%. This estimation does not take into account any genetic testing results.  The patient's lifetime breast cancer risk is a preliminary estimate based on available information using one of several models endorsed by the American Cancer Society (ACS). The ACS recommends consideration of breast MRI screening as an adjunct to mammography for patients at high risk (defined as 20% or greater lifetime risk). A more detailed breast cancer risk assessment can be considered, if clinically indicated.   Ms. Broadus John has been determined to be at high risk for breast cancer.  Therefore, we recommend that annual screening with mammography  and breast MRI begin at age 19, or 10 years prior to the age of breast cancer diagnosis in a relative (whichever is earlier).  We discussed that [name] should discuss her individual situation with her referring physician and determine a breast cancer screening plan with which they are both comfortable.    PLAN: After considering the risks, benefits, and limitations, [name]  provided informed consent to pursue genetic testing and the blood sample was sent to [facility] for analysis of the Hereditary cancer panel. Results should be available within approximately 2-3 weeks' time, at which point they will be disclosed by telephone to [name], as will any additional recommendations warranted by these results. [name] will receive a summary of her genetic counseling visit and a copy of her results once available. This information will also be available in Epic. We encouraged [name] to remain in contact with cancer genetics annually so that we can  continuously update the family history and inform her of any changes in cancer genetics and testing that may be of benefit for her family. [name] questions were answered to her satisfaction today. Our contact information was provided should additional questions or concerns arise.  Lastly, we encouraged [name] to remain in contact with cancer genetics annually so that we can continuously update the family history and inform her of any changes in cancer genetics and testing that may be of benefit for this family.   Ms.  Janeann Merl questions were answered to her satisfaction today. Our contact information was provided should additional questions or concerns arise. Thank you for the referral and allowing [place] to share in the care of your patient.   Navi Ewton P. Florene Glen, [place], [facility] Certified Genetic Counselor [email or web address] phone: [phone]  The patient was seen for a total of 60 minutes in face-to-face genetic counseling.  This patient was discussed with Drs. Magrinat, Lindi Adie and/or Burr Medico who agrees with the above.    _______________________________________________________________________ For Office Staff:  Number of people involved in session: 1 Was an Intern/ student involved with case: Pharmacy Intern

## 2015-08-30 NOTE — Telephone Encounter (Signed)
Explained that I had not performed a risk assessment when she was in the office and wanted to call and update her.  Explained that risk assessment tools do not take into consideration of her hyperplasia of the fallopian tube, and I did not indicate that she had ovarian cancer on the assessment, so the results are only an estimate.  Discussed that based on the assessment, genetic testing is appropriate, but that her risk for having a BRCA mutation is approximately 2%.  However, her risk for breast cancer is elevated at 35%.  This is most likely because she has had two breast biopsies in the past, and had her first child over the age of 33. She will discuss this at her next MD appointment.

## 2016-10-25 ENCOUNTER — Encounter: Payer: Self-pay | Admitting: Genetic Counselor

## 2016-10-25 ENCOUNTER — Ambulatory Visit: Payer: Self-pay | Admitting: Genetic Counselor

## 2016-10-25 DIAGNOSIS — Z803 Family history of malignant neoplasm of breast: Secondary | ICD-10-CM

## 2016-10-25 DIAGNOSIS — Z8041 Family history of malignant neoplasm of ovary: Secondary | ICD-10-CM

## 2016-10-25 DIAGNOSIS — N838 Other noninflammatory disorders of ovary, fallopian tube and broad ligament: Secondary | ICD-10-CM

## 2016-10-25 DIAGNOSIS — Z1379 Encounter for other screening for genetic and chromosomal anomalies: Secondary | ICD-10-CM

## 2016-10-25 NOTE — Progress Notes (Addendum)
HPI: Terri Lopez was previously seen in the [facility] clinic due to a family history of cancer and concerns regarding a hereditary predisposition to cancer. Please refer to our prior cancer genetics clinic note for more information regarding Terri Lopez's medical, social and family histories, and our assessment and recommendations, at the time. Terri Lopez recent genetic test results were disclosed to her, as were recommendations warranted by these results. These results and recommendations are discussed in more detail below.  CANCER HISTORY:   No history exists.    FAMILY HISTORY:  We obtained a detailed, 4-generation family history.  Significant diagnoses are listed below: Family History  Problem Relation Age of Onset  . Diabetes Mother        type II  . Colon polyps Mother   . Aneurysm Maternal Aunt 30       brain  . Alcohol abuse Maternal Uncle   . Ovarian cancer Maternal Grandmother   . Alzheimer's disease Maternal Grandmother   . Colon cancer Paternal Grandfather        dx in his 79s-70s  . Breast cancer Maternal Aunt        mother's maternal 1/2 sister    The patient has two children who are healthy and cancer free.  She has one maternal half brother and one paternal half brother who are both healthy and cancer free.  Her mother has had colon polyps on her colonoscopy, but the patient is unsure of how many.  She reports that her father is healthy.  The patient's mother has one full brother and full sister.  The sister died of a brain aneurysm in her 59's and the brother is estranged due to alcohol problems.  She has several half siblings.  A maternal half sister had breast cancer at an unknown age.  The patient's maternal grandmother had ovarian cancer, reportedly under the age of 13.  She currently has Alzheimer's disease.  Patient's maternal ancestors are of [place] descent, and paternal ancestors are of [place], [place] and [place] descent. There is no reported  Ashkenazi Jewish ancestry. There is no known consanguinity.  GENETIC TEST RESULTS: Genetic testing reported out in April 2017 through the Common Hereditary cancer panel found no deleterious mutations.  The Hereditary Gene Panel offered by [facility] includes sequencing and/or deletion duplication testing of the following 46 genes: APC, ATM, AXIN2, BARD1, BMPR1A, BRCA1, BRCA2, BRIP1, CDH1, CDKN2A (p14ARF), CDKN2A (p16INK4a), CHEK2, CTNNA1, DICER1, EPCAM (Deletion/duplication testing only), GREM1 (promoter region deletion/duplication testing only), KIT, MEN1, MLH1, MSH2, MSH3, MSH6, MUTYH, NBN, NF1, NHTL1, PALB2, PDGFRA, PMS2, POLD1, POLE, PTEN, RAD50, RAD51C, RAD51D, SDHB, SDHC, SDHD, SMAD4, SMARCA4. STK11, TP53, TSC1, TSC2, and VHL.  The following genes were evaluated for sequence changes only: SDHA and HOXB13 c.251G>A variant only.  The test report has been scanned into EPIC and is located under the Molecular Pathology section of the Results Review tab.   We discussed with Terri Lopez that since the current genetic testing is not perfect, it is possible there may be a gene mutation in one of these genes that current testing cannot detect, but that chance is small. We also discussed, that it is possible that another gene that has not yet been discovered, or that we have not yet tested, is responsible for the cancer diagnoses in the family, and it is, therefore, important to remain in touch with cancer genetics in the future so that we can continue to offer Terri Lopez the most up to date genetic  testing.   Genetic testing did detect two Variants of Unknown Significance - one in the TSC2 gene called c.4298C>T and another in the RAD50 gene called c.670C>T.   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  At this time, it is unknown if the RAD50 variant is associated with increased cancer risk or if this is a normal finding, but most variants such as this get reclassified to being inconsequential. It  should not be used to make medical management decisions. With time, we suspect the lab will determine the significance of this variant, if any. If we do learn more about it, we will try to contact Terri Lopez to discuss it further. However, it is important to stay in touch with [place] periodically and keep the address and phone number up to date.    CANCER SCREENING RECOMMENDATIONS:  This normal result is reassuring and indicates that Terri Lopez does not likely have an increased risk of cancer due to a mutation in one of these genes.  We, therefore, recommended  Terri Lopez continue to follow the cancer screening guidelines provided by her primary healthcare providers.   RECOMMENDATIONS FOR FAMILY MEMBERS: Women in this family might be at some increased risk of developing cancer, over the general population risk, simply due to the family history of cancer. We recommended women in this family have a yearly mammogram beginning at age 7, or 8 years younger than the earliest onset of cancer, an annual clinical breast exam, and perform monthly breast self-exams. Women in this family should also have a gynecological exam as recommended by their primary provider. All family members should have a colonoscopy by age 28.  FOLLOW-UP: Lastly, we discussed with Terri Lopez that cancer genetics is a rapidly advancing field and it is possible that new genetic tests will be appropriate for her and/or her family members in the future. We encouraged her to remain in contact with cancer genetics on an annual basis so we can update her personal and family histories and let her know of advances in cancer genetics that may benefit this family.   Our contact number was provided. Terri Lopez questions were answered to her satisfaction, and she knows she is welcome to call us at anytime with additional questions or concerns.   Roma Kayser, MS, [facility] Certified Genetic Counselor [email or web address]

## 2017-06-01 HISTORY — PX: UMBILICAL HERNIA REPAIR: SUR1181

## 2021-03-27 ENCOUNTER — Other Ambulatory Visit: Payer: Self-pay | Admitting: Endocrinology

## 2021-03-27 DIAGNOSIS — E890 Postprocedural hypothyroidism: Secondary | ICD-10-CM

## 2021-04-09 ENCOUNTER — Other Ambulatory Visit: Payer: Self-pay

## 2021-04-10 ENCOUNTER — Other Ambulatory Visit: Payer: Self-pay

## 2021-05-07 ENCOUNTER — Ambulatory Visit
Admission: RE | Admit: 2021-05-07 | Discharge: 2021-05-07 | Disposition: A | Discharge status: Home / Self Care | Payer: Self-pay | Source: Ambulatory Visit | Attending: Endocrinology | Admitting: Endocrinology

## 2021-05-07 DIAGNOSIS — E890 Postprocedural hypothyroidism: Secondary | ICD-10-CM

## 2021-08-31 HISTORY — PX: ENDOMETRIAL ABLATION W/ NOVASURE: SUR434

## 2022-03-10 ENCOUNTER — Other Ambulatory Visit: Payer: Self-pay

## 2022-03-10 ENCOUNTER — Emergency Department (HOSPITAL_COMMUNITY): Payer: Self-pay

## 2022-03-10 ENCOUNTER — Emergency Department (HOSPITAL_COMMUNITY)
Admission: EM | Admit: 2022-03-10 | Discharge: 2022-03-10 | Disposition: A | Discharge status: Home / Self Care | Payer: Self-pay | Attending: Emergency Medicine | Admitting: Emergency Medicine

## 2022-03-10 ENCOUNTER — Encounter (HOSPITAL_COMMUNITY): Payer: Self-pay

## 2022-03-10 DIAGNOSIS — R1031 Right lower quadrant pain: Secondary | ICD-10-CM | POA: Insufficient documentation

## 2022-03-10 DIAGNOSIS — E039 Hypothyroidism, unspecified: Secondary | ICD-10-CM | POA: Insufficient documentation

## 2022-03-10 DIAGNOSIS — N9489 Other specified conditions associated with female genital organs and menstrual cycle: Secondary | ICD-10-CM | POA: Insufficient documentation

## 2022-03-10 LAB — COMPREHENSIVE METABOLIC PANEL
ALT: 23 U/L (ref 0–44)
AST: 20 U/L (ref 15–41)
Albumin: 4.3 g/dL (ref 3.5–5.0)
Alkaline Phosphatase: 36 U/L — ABNORMAL LOW (ref 38–126)
Anion gap: 10 (ref 5–15)
BUN: 10 mg/dL (ref 6–20)
CO2: 24 mmol/L (ref 22–32)
Calcium: 9.3 mg/dL (ref 8.9–10.3)
Chloride: 104 mmol/L (ref 98–111)
Creatinine, Ser: 0.89 mg/dL (ref 0.44–1.00)
GFR, Estimated: >60 mL/min (ref >60)
Glucose, Bld: 109 mg/dL — ABNORMAL HIGH (ref 70–99)
Potassium: 4.2 mmol/L (ref 3.5–5.1)
Sodium: 138 mmol/L (ref 135–145)
Total Bilirubin: 0.4 mg/dL (ref 0.3–1.2)
Total Protein: 7.2 g/dL (ref 6.5–8.1)

## 2022-03-10 LAB — URINALYSIS, ROUTINE W REFLEX MICROSCOPIC
Bacteria, UA: NONE SEEN
Bilirubin Urine: NEGATIVE
Glucose, UA: NEGATIVE mg/dL
Ketones, ur: NEGATIVE mg/dL
Leukocytes,Ua: NEGATIVE
Nitrite: NEGATIVE
Protein, ur: NEGATIVE mg/dL
Specific Gravity, Urine: 1.025 (ref 1.005–1.030)
pH: 5 (ref 5.0–8.0)

## 2022-03-10 LAB — CBC
HCT: 42.4 % (ref 36.0–46.0)
Hemoglobin: 14.2 g/dL (ref 12.0–15.0)
MCH: 34.1 pg — ABNORMAL HIGH (ref 26.0–34.0)
MCHC: 33.5 g/dL (ref 30.0–36.0)
MCV: 101.9 fL — ABNORMAL HIGH (ref 80.0–100.0)
Platelets: 384 10*3/uL (ref 150–400)
RBC: 4.16 MIL/uL (ref 3.87–5.11)
RDW: 12.8 % (ref 11.5–15.5)
WBC: 10.6 10*3/uL — ABNORMAL HIGH (ref 4.0–10.5)
nRBC: 0 % (ref 0.0–0.2)

## 2022-03-10 LAB — I-STAT BETA HCG BLOOD, ED (MC, WL, AP ONLY): I-stat hCG, quantitative: <5 m[IU]/mL (ref <5)

## 2022-03-10 MED ORDER — IOHEXOL 350 MG/ML SOLN
75.0000 mL | Freq: Once | INTRAVENOUS | Status: AC | PRN
  Administered 2022-03-10: 75 mL via INTRAVENOUS

## 2022-03-10 MED ORDER — ONDANSETRON HCL 4 MG PO TABS: 4.0000 mg | ORAL_TABLET | Freq: Three times a day (TID) | ORAL | 0 refills | Status: DC | PRN

## 2022-03-10 MED ORDER — OXYCODONE-ACETAMINOPHEN 5-325 MG PO TABS: 1.0000 | ORAL_TABLET | ORAL | 0 refills | Status: DC | PRN

## 2022-03-10 MED ORDER — HYDROMORPHONE HCL 1 MG/ML IJ SOLN
1.0000 mg | Freq: Once | INTRAMUSCULAR | Status: AC
  Administered 2022-03-10: 1 mg via INTRAVENOUS
  Filled 2022-03-10: qty 1

## 2022-03-10 MED ORDER — ONDANSETRON HCL 4 MG/2ML IJ SOLN
4.0000 mg | Freq: Once | INTRAMUSCULAR | Status: AC
  Administered 2022-03-10: 4 mg via INTRAVENOUS
  Filled 2022-03-10: qty 2

## 2022-03-10 NOTE — ED Notes (Signed)
Patient transported to [facility] 

## 2022-03-10 NOTE — ED Provider Notes (Signed)
[facility] EMERGENCY DEPARTMENT Provider Note   CSN: [id number] Arrival date & time: 03/10/22  0757     History  Chief Complaint  Patient presents with   Abdominal Pain    Terri Lopez is a 46 y.o. female.  The history is provided by the patient, the spouse and medical records. No language interpreter was used.  Abdominal Pain Pain location:  RLQ and suprapubic Pain quality: aching and cramping   Pain radiates to:  Suprapubic region Pain severity:  Severe Onset quality:  Gradual Duration:  2 days (2 days acutelty worsened) Timing:  Constant Progression:  Worsening Chronicity:  Recurrent Context: not trauma   Relieved by:  Nothing Worsened by:  Palpation and position changes Ineffective treatments:  None tried Associated symptoms: diarrhea and nausea   Associated symptoms: no chest pain, no chills, no constipation, no cough, no dysuria, no fatigue, no fever, no hematuria, no shortness of breath, no vaginal bleeding, no vaginal discharge and no vomiting        Home Medications Prior to Admission medications   Medication Sig Start Date End Date Taking? Authorizing Provider  ferrous sulfate 325 (65 FE) MG tablet Take 1 tablet (325 mg total) by mouth 2 (two) times daily with a meal. 05/25/15   Standard, Venus, CNM  ibuprofen (ADVIL,MOTRIN) 600 MG tablet Take 1 tablet (600 mg total) by mouth every 6 (six) hours. Patient not taking: Reported on 07/03/2015 05/25/15   Standard, Venus, CNM  levothyroxine (SYNTHROID, LEVOTHROID) 175 MCG tablet Take 200 mcg by mouth daily before breakfast.     [provider]  Prenatal Vit-Fe Fumarate-FA (PRENATAL MULTIVITAMIN) TABS tablet Take 1 tablet by mouth daily at 12 noon.    [provider]  senna-docusate (SENOKOT-S) 8.6-50 MG tablet Take 2 tablets by mouth daily. Patient not taking: Reported on 07/03/2015 05/25/15   Standard, Venus, CNM      Allergies    Patient has no known allergies.    Review of  Systems   Review of Systems  Constitutional:  Negative for chills, fatigue and fever.  HENT:  Negative for congestion.   Respiratory:  Negative for cough, chest tightness and shortness of breath.   Cardiovascular:  Negative for chest pain.  Gastrointestinal:  Positive for abdominal pain, diarrhea and nausea. Negative for constipation and vomiting.  Genitourinary:  Negative for dysuria, flank pain, frequency, hematuria, vaginal bleeding, vaginal discharge and vaginal pain.  Musculoskeletal:  Negative for back pain, neck pain and neck stiffness.  Skin:  Negative for rash and wound.  Neurological:  Negative for light-headedness and headaches.  Psychiatric/Behavioral:  Negative for confusion.   All other systems reviewed and are negative.   Physical Exam Updated Vital Signs BP (!) 161/99 (BP Location: Left Arm)   Pulse 70   Temp 98.5 F (36.9 C) (Oral)   Resp 16   Ht 5\' 2"  (1.575 m)   Wt 87.5 kg   SpO2 97%   BMI 35.28 kg/m  Physical Exam Vitals and nursing note reviewed.  Constitutional:      General: She is not in acute distress.    Appearance: She is well-developed. She is not ill-appearing, toxic-appearing or diaphoretic.  HENT:     Head: Normocephalic and atraumatic.  Eyes:     General: No scleral icterus.    Conjunctiva/sclera: Conjunctivae normal.  Cardiovascular:     Rate and Rhythm: Normal rate and regular rhythm.     Heart sounds: No murmur heard. Pulmonary:  Effort: Pulmonary effort is normal. No respiratory distress.     Breath sounds: Normal breath sounds. No wheezing, rhonchi or rales.  Chest:     Chest wall: No tenderness.  Abdominal:     General: Abdomen is flat. Bowel sounds are normal. There is no distension.     Palpations: Abdomen is soft.     Tenderness: There is abdominal tenderness in the right lower quadrant and suprapubic area. There is no guarding or rebound.  Genitourinary:    Comments: Deferred by pt during shared decision making  conversation give lack of bleeding, discharge, or STI concern.  Musculoskeletal:        General: No swelling.     Cervical back: Neck supple.  Skin:    General: Skin is warm and dry.     Capillary Refill: Capillary refill takes less than 2 seconds.  Neurological:     Mental Status: She is alert.  Psychiatric:        Mood and Affect: Mood normal.     ED Results / Procedures / Treatments   Labs (all labs ordered are listed, but only abnormal results are displayed) Labs Reviewed  COMPREHENSIVE METABOLIC PANEL - Abnormal; Notable for the following components:      Result Value   Glucose, Bld 109 (*)    Alkaline Phosphatase 36 (*)    All other components within normal limits  CBC - Abnormal; Notable for the following components:   WBC 10.6 (*)    MCV 101.9 (*)    MCH 34.1 (*)    All other components within normal limits  URINALYSIS, ROUTINE W REFLEX MICROSCOPIC - Abnormal; Notable for the following components:   Hgb urine dipstick SMALL (*)    All other components within normal limits  I-STAT BETA HCG BLOOD, ED ([facility], [facility], [facility] ONLY)    EKG None  Radiology US PELVIC COMPLETE W TRANSVAGINAL AND TORSION R/O  Result Date: 03/10/2022 CLINICAL DATA:  Right lower quadrant pain. EXAM: TRANSABDOMINAL AND TRANSVAGINAL ULTRASOUND OF PELVIS DOPPLER ULTRASOUND OF OVARIES TECHNIQUE: Both transabdominal and transvaginal ultrasound examinations of the pelvis were performed. Transabdominal technique was performed for global imaging of the pelvis including uterus, ovaries, adnexal regions, and pelvic cul-de-sac. It was necessary to proceed with endovaginal exam following the transabdominal exam to visualize the endometrium and ovaries to better advantage. Color and duplex Doppler ultrasound was utilized to evaluate blood flow to the ovaries. COMPARISON:  Current abdomen and pelvis CT. FINDINGS: Uterus Measurements: 7.7 x 4.6 x 4.6 cm = volume: 84 mL. No fibroids or other mass visualized. Endometrium  Thickness: 3 mm. Trace amount of endometrial canal fluid, nonspecific. No focal abnormality visualized. Right ovary Measurements: 2.1 x 1.8 x 1.8 cm = volume: 3.5 mL. Normal appearance/no adnexal mass. Left ovary Measurements: 2.5 x 1.4 x 2.1 cm = volume: 4.1 mL. Normal appearance/no adnexal mass. Pulsed Doppler evaluation of both ovaries demonstrates normal low-resistance arterial and venous waveforms. Other findings No abnormal free fluid. IMPRESSION: Normal transabdominal and endovaginal pelvic ultrasound. No findings to account for right lower quadrant pain. Electronically Signed   By: Lajean Manes M.D.   On: 03/10/2022 14:05   CT ABDOMEN PELVIS W CONTRAST  Result Date: 03/10/2022 CLINICAL DATA:  Right lower quadrant and right pelvic pain for several weeks. EXAM: CT ABDOMEN AND PELVIS WITH CONTRAST TECHNIQUE: Multidetector CT imaging of the abdomen and pelvis was performed using the standard protocol following bolus administration of intravenous contrast. RADIATION DOSE REDUCTION: This exam was performed according  to the departmental dose-optimization program which includes automated exposure control, adjustment of the mA and/or kV according to patient size and/or use of iterative reconstruction technique. CONTRAST:  72mL OMNIPAQUE IOHEXOL 350 MG/ML SOLN COMPARISON:  None Available. FINDINGS: Lower Chest: No acute findings. Hepatobiliary: No hepatic masses identified. Gallbladder is unremarkable. No evidence of biliary ductal dilatation. Pancreas:  No mass or inflammatory changes. Spleen: Within normal limits in size and appearance. Adrenals/Urinary Tract: No suspicious masses identified. 2 mm nonobstructing calculus noted in lower pole of right kidney. No evidence of ureteral calculi or hydronephrosis. Stomach/Bowel: No evidence of obstruction, inflammatory process or abnormal fluid collections. Normal appendix visualized. Vascular/Lymphatic: No pathologically enlarged lymph nodes. No acute vascular  findings. Aortic atherosclerotic calcification incidentally noted. Reproductive:  No mass or other significant abnormality. Other:  None. Musculoskeletal:  No suspicious bone lesions identified. IMPRESSION: No evidence of appendicitis or other acute findings. Tiny nonobstructing right renal calculus. Aortic Atherosclerosis (ICD10-I70.0). Electronically Signed   By: Danae Orleans M.D.   On: 03/10/2022 11:50    Procedures Procedures    Medications Ordered in ED Medications  HYDROmorphone (DILAUDID) injection 1 mg (1 mg Intravenous Given 03/10/22 1013)  ondansetron (ZOFRAN) injection 4 mg (4 mg Intravenous Given 03/10/22 1013)  iohexol (OMNIPAQUE) 350 MG/ML injection 75 mL (75 mLs Intravenous Contrast Given 03/10/22 1124)    ED Course/ Medical Decision Making/ A&P                           Medical Decision Making Amount and/or Complexity of Data Reviewed Labs: ordered. Radiology: ordered.  Risk Prescription drug management.    Pierre Cumpton is a 46 y.o. female with a past medical history significant for hypothyroidism, previous C-sections, and report of previous gynecological ablation therapy this past March who presents with pain in her right lower quadrant.  According to patient, since her seizure back in March, she has had some mild pain in her right lower quadrant that comes and goes.  She says that it worsened in August and she had another procedure where her provider "poked a hole through it" and let some blood out.  She said that she was instructed to call back if symptoms ever worsen acutely.  Patient reports that over the last 2 days it has acutely worsened but the clinic was closed so she came in for evaluation.  She reports the pain is "20 out of 10" and is severe.  She had some nausea but no vomiting.  She reports a mild diarrhea but no constipation.  She denies trauma and denies any vaginal discharge or vaginal bleeding.  She reports the pain is slightly more superior in her right  lower quadrant than just her right pelvis area but does radiate down there.  She is unsure feels exactly like the pain from her uterus or not.  She denies other fevers, chills, or URI symptoms.  On exam, lungs clear and chest nontender.  Abdomen is quite tender in the right lower quadrant.  Rest of abdomen is nontender.  Bowel sounds are appreciated.  Exam otherwise unremarkable.  Had a shared decision-making conversation with patient.  Given the patient's right lower quadrant pain and tenderness I am somewhat concerned we need to rule out multiple different etiologies of pain including appendicitis, right side diverticulitis, some other abscess, hematoma, or other post surgical complication in her right abdomen.  Due to the location of her discomfort, we will start with a CT scan and  I anticipate we may need to get a pelvic ultrasound as well.  We had a shared decision-making conversation and given her lack of any vaginal bleeding or vaginal discharge, she agrees to hold on bimanual pelvic exam initially.  We will give her pain medicine, nausea medicine, and we will keep her n.p.o.  Patient had some screening labs in triage that does show a small leukocytosis but urinalysis does not show UTI.  Otherwise metabolic panel overall reassuring.  Anticipate reassessment after imaging is completed.  CT scan returned did not show appendicitis or other acute abnormality.  Given the patient's continued discomfort in the gynecologic history we discussed, we will get ultrasound.  Ultrasound was obtained and also did not show acute abnormality.  I suspect she does not have abscess or large hematoma or other acute surgical this time however given her continued pain similar to her gynecologic pain, will give prescription for pain medicine and nausea medicine and allow her to follow-up with her OB/GYN team this week.  Patient agrees with plan of care and had other questions or concerns.  Patient discharged in good  condition.        Final Clinical Impression(s) / ED Diagnoses Final diagnoses:  Right lower quadrant abdominal pain    Rx / DC Orders ED Discharge Orders          Ordered    oxyCODONE-acetaminophen (PERCOCET/ROXICET) 5-325 MG tablet  Every 4 hours PRN        03/10/22 1426    ondansetron (ZOFRAN) 4 MG tablet  Every 8 hours PRN        03/10/22 1426            Clinical Impression: 1. Right lower quadrant abdominal pain     Disposition: Discharge  Condition: Good  I have discussed the results, Dx and Tx plan with the pt(& family if present). He/she/they expressed understanding and agree(s) with the plan. Discharge instructions discussed at great length. Strict return precautions discussed and pt &/or family have verbalized understanding of the instructions. No further questions at time of discharge.    New Prescriptions   ONDANSETRON (ZOFRAN) 4 MG TABLET    Take 1 tablet (4 mg total) by mouth every 8 (eight) hours as needed for nausea or vomiting.   OXYCODONE-ACETAMINOPHEN (PERCOCET/ROXICET) 5-325 MG TABLET    Take 1 tablet by mouth every 4 (four) hours as needed for severe pain.    Follow Up: your OBGYN and PCP     Nonnie Done., MD [address] [place] [address] [phone]        Arkeem Harts, Canary Brim, MD 03/10/22 [phone]

## 2022-03-10 NOTE — Discharge Instructions (Signed)
Your history and exam today led [place] to get both ultrasound and CT to look for concerning causes of your right lower quadrant abdominal pain.  Fortunately, we did not see evidence of ovarian or uterine pathology nor did we see evidence of appendicitis.  Your labs were overall reassuring however I still suspect your pain may be related to your recent gynecological procedures.  Please use the pain medicine nausea medicine help with symptoms and please call your OB/GYN tomorrow to discuss close follow-up.  If any symptoms change or worsen, please return to the nearest emergency department.

## 2022-03-10 NOTE — ED Triage Notes (Signed)
Patient complains of ongoing lower abdominal pain with radiation down right leg. Patient complains of ongoing pelvic pain since ablation in march and revisit to GYN in August. Denies dysuria

## 2022-06-03 HISTORY — PX: COLONOSCOPY WITH PROPOFOL: SHX5780

## 2023-02-07 DIAGNOSIS — R070 Pain in throat: Secondary | ICD-10-CM | POA: Diagnosis not present

## 2023-02-07 DIAGNOSIS — H1045 Other chronic allergic conjunctivitis: Secondary | ICD-10-CM | POA: Diagnosis not present

## 2023-02-18 DIAGNOSIS — F418 Other specified anxiety disorders: Secondary | ICD-10-CM | POA: Diagnosis not present

## 2023-02-18 DIAGNOSIS — Z1231 Encounter for screening mammogram for malignant neoplasm of breast: Secondary | ICD-10-CM | POA: Diagnosis not present

## 2023-02-18 DIAGNOSIS — N946 Dysmenorrhea, unspecified: Secondary | ICD-10-CM | POA: Diagnosis not present

## 2023-02-18 DIAGNOSIS — Z6832 Body mass index (BMI) 32.0-32.9, adult: Secondary | ICD-10-CM | POA: Diagnosis not present

## 2023-02-18 DIAGNOSIS — Z803 Family history of malignant neoplasm of breast: Secondary | ICD-10-CM | POA: Diagnosis not present

## 2023-02-18 DIAGNOSIS — Z1211 Encounter for screening for malignant neoplasm of colon: Secondary | ICD-10-CM | POA: Diagnosis not present

## 2023-02-18 DIAGNOSIS — R350 Frequency of micturition: Secondary | ICD-10-CM | POA: Diagnosis not present

## 2023-02-18 DIAGNOSIS — Z3040 Encounter for surveillance of contraceptives, unspecified: Secondary | ICD-10-CM | POA: Diagnosis not present

## 2023-02-18 DIAGNOSIS — Z01419 Encounter for gynecological examination (general) (routine) without abnormal findings: Secondary | ICD-10-CM | POA: Diagnosis not present

## 2023-02-26 DIAGNOSIS — F411 Generalized anxiety disorder: Secondary | ICD-10-CM | POA: Diagnosis not present

## 2023-03-03 DIAGNOSIS — N946 Dysmenorrhea, unspecified: Secondary | ICD-10-CM | POA: Diagnosis not present

## 2023-03-03 DIAGNOSIS — R0981 Nasal congestion: Secondary | ICD-10-CM | POA: Diagnosis not present

## 2023-03-03 DIAGNOSIS — J209 Acute bronchitis, unspecified: Secondary | ICD-10-CM | POA: Diagnosis not present

## 2023-03-05 DIAGNOSIS — F411 Generalized anxiety disorder: Secondary | ICD-10-CM | POA: Diagnosis not present

## 2023-03-10 DIAGNOSIS — F411 Generalized anxiety disorder: Secondary | ICD-10-CM | POA: Diagnosis not present

## 2023-03-27 DIAGNOSIS — F411 Generalized anxiety disorder: Secondary | ICD-10-CM | POA: Diagnosis not present

## 2023-04-02 DIAGNOSIS — F411 Generalized anxiety disorder: Secondary | ICD-10-CM | POA: Diagnosis not present

## 2023-04-10 ENCOUNTER — Encounter: Payer: Self-pay | Admitting: Family Medicine

## 2023-04-10 ENCOUNTER — Ambulatory Visit: Payer: Medicaid Other | Admitting: Family Medicine

## 2023-04-10 VITALS — BP 115/81 | HR 109 | Ht 62.0 in | Wt 189.4 lb

## 2023-04-10 DIAGNOSIS — F410 Panic disorder [episodic paroxysmal anxiety] without agoraphobia: Secondary | ICD-10-CM

## 2023-04-10 DIAGNOSIS — E6609 Other obesity due to excess calories: Secondary | ICD-10-CM | POA: Diagnosis not present

## 2023-04-10 DIAGNOSIS — E66811 Obesity, class 1: Secondary | ICD-10-CM | POA: Diagnosis not present

## 2023-04-10 DIAGNOSIS — N809 Endometriosis, unspecified: Secondary | ICD-10-CM

## 2023-04-10 DIAGNOSIS — E039 Hypothyroidism, unspecified: Secondary | ICD-10-CM | POA: Diagnosis not present

## 2023-04-10 DIAGNOSIS — Z6834 Body mass index (BMI) 34.0-34.9, adult: Secondary | ICD-10-CM | POA: Diagnosis not present

## 2023-04-10 DIAGNOSIS — Z7689 Persons encountering health services in other specified circumstances: Secondary | ICD-10-CM | POA: Diagnosis not present

## 2023-04-10 NOTE — Assessment & Plan Note (Signed)
Patient currently being prescribed Xanax as needed up to 3 times daily but takes it less often for panic disorder/anxiety.  Patient expresses desire to eventually wean off of this medication.  With the recent illness of her husband and mother, not a good time right now to wean off.  Has tried other medications for anxiety in the past including SSRI, SNRI bupropion.  These were ineffective.

## 2023-04-10 NOTE — Assessment & Plan Note (Signed)
Currently takes 200 mcg levothyroxine.  Do not see any recent TSH level so we will order repeat TSH

## 2023-04-10 NOTE — Patient Instructions (Signed)
It was nice to see you today,  We addressed the following topics today: -I would like to see you back in 3 months - Next time you need a refill for your prescription you will have to tell the pharmacy to send it to [place] - I would like you to schedule a lab visit to get some blood test and we will follow-up the results with you when we get them.  Have a great day,  Frederic Jericho, MD

## 2023-04-10 NOTE — Progress Notes (Signed)
New Patient Office Visit  Subjective    Patient ID: Terri Lopez, female    DOB: Jan 16, 1976  Age: 47 y.o. MRN: [id number]  CC:  Chief Complaint  Patient presents with   New Patient (Initial Visit)    HPI Terri Lopez presents to establish care  Patient previous went to Dr. Egbert Garibaldi in [place].  Had to switch when her insurance switched to Medicaid.  Patient was being treated for hypothyroidism from Graves' disease status post thyroidectomy.  Has been taking 200 mcg of levothyroxine.  Anxiety-patient been taking Xanax up to 3 times daily as needed for panic attacks.  Patient has tried antidepressants including SSRIs SNRIs and bupropion in the past and either did not tolerate where they were ineffective.  She only takes her Xanax 1-2 times a day.  She is currently under more stress because her mother is ill and her husband was just diagnosed with throat cancer and is beginning to undergo treatment.  She also has 2 teenage children.  Patient also sees a therapist about every other week.  Patient also recently started Depo-Lupron for endometriosis.  Has had an endometrial ablation in the past that did not fix the issue.  PMH: Anxiety, hypothyroid (Graves').   PSH: Cesarean, thyroidectomy, umbilical hernia.    FH: mother - skin cancer, vulvar cancer, copd, mitral valve disease, DM, mgm - breast cancer? Alzheimer's.  Mgf - MI, pgm - dementia, pgf - colon cancer   Tobacco use: cigarettes - 3 cigarettes, 25 years.  1ppd  Alcohol use: 2/week Drug use: no Marital status: married - 2 children.   Employment: not working.   Taking care of family.    Screenings:  Colon Cancer: 2 years ago. 5 year f/u?  Breast Cancer: mammogram - in August.   Cervical cancer:    Outpatient Encounter Medications as of 04/10/2023  Medication Sig   levothyroxine (SYNTHROID, LEVOTHROID) 175 MCG tablet Take 200 mcg by mouth daily before breakfast.    methocarbamol (ROBAXIN) 500 MG tablet TAKE 2 TABLETS  THREE TIMES DAILY AS NEEDED FOR BACK PAIN   SYNTHROID 200 MCG tablet Take 200 mcg by mouth daily.   ibuprofen (ADVIL,MOTRIN) 600 MG tablet Take 1 tablet (600 mg total) by mouth every 6 (six) hours. (Patient not taking: Reported on 07/03/2015)   ketorolac (TORADOL) 10 MG tablet Take 1 tablet by mouth every 6 (six) hours as needed.   [DISCONTINUED] ferrous sulfate 325 (65 FE) MG tablet Take 1 tablet (325 mg total) by mouth 2 (two) times daily with a meal.   [DISCONTINUED] ondansetron (ZOFRAN) 4 MG tablet Take 1 tablet (4 mg total) by mouth every 8 (eight) hours as needed for nausea or vomiting.   [DISCONTINUED] oxyCODONE-acetaminophen (PERCOCET/ROXICET) 5-325 MG tablet Take 1 tablet by mouth every 4 (four) hours as needed for severe pain.   [DISCONTINUED] Prenatal Vit-Fe Fumarate-FA (PRENATAL MULTIVITAMIN) TABS tablet Take 1 tablet by mouth daily at 12 noon.   [DISCONTINUED] senna-docusate (SENOKOT-S) 8.6-50 MG tablet Take 2 tablets by mouth daily. (Patient not taking: Reported on 07/03/2015)   No facility-administered encounter medications on file as of 04/10/2023.    Past Medical History:  Diagnosis Date   Family history of breast cancer    Family history of ovarian cancer    Medical history non-contributory    Panic attacks    Previous cesarean section 05/21/2015    Past Surgical History:  Procedure Laterality Date   CESAREAN SECTION N/A 01/21/2013   Procedure: CESAREAN SECTION;  Surgeon: Turner Daniels, MD;  Location: [facility];  Service: Obstetrics;  Laterality: N/A;   CESAREAN SECTION WITH BILATERAL TUBAL LIGATION Bilateral 05/23/2015   Procedure: CESAREAN SECTION WITH BILATERAL TUBAL LIGATION;  Surgeon: Silverio Lay, MD;  Location: [facility];  Service: Obstetrics;  Laterality: Bilateral;   THYROID SURGERY  2006    Family History  Problem Relation Age of Onset   Diabetes Mother        type II   Colon polyps Mother    Aneurysm Maternal Aunt 30       brain   Alcohol abuse Maternal Uncle     Ovarian cancer Maternal Grandmother    Alzheimer's disease Maternal Grandmother    Colon cancer Paternal Grandfather        dx in his 61s-70s   Breast cancer Maternal Aunt        mother's maternal 1/2 sister    Social History   Socioeconomic History   Marital status: Married    Spouse name: Not on file   Number of children: 2   Years of education: Not on file   Highest education level: Not on file  Occupational History   Not on file  Tobacco Use   Smoking status: Every Day    Current packs/day: 0.00    Types: Cigarettes    Last attempt to quit: 02/22/2015    Years since quitting: 8.1   Smokeless tobacco: Never  Substance and Sexual Activity   Alcohol use: Yes    Comment: rarely   Drug use: No   Sexual activity: Yes    Birth control/protection: None  Other Topics Concern   Not on file  Social History Narrative   Not on file   Social Determinants of Health   Financial Resource Strain: Not on file  Food Insecurity: Not on file  Transportation Needs: Not on file  Physical Activity: Not on file  Stress: Not on file  Social Connections: Not on file  Intimate Partner Violence: Not on file    ROS      Objective    BP 115/81   Pulse (!) 109   Ht 5\' 2"  (1.575 m)   Wt 189 lb 6.4 oz (85.9 kg)   SpO2 98%   BMI 34.64 kg/m   Physical Exam General: Alert, oriented HEENT: PERRLA, EOMI, moist mucosa. CV: Regular rate and rhythm no murmurs Pulmonary: Lungs clear bilaterally no wheeze or crackles. GI: Soft, nontender.  Normal bowel sounds MSK: Strength 0 bilaterally Extremities: No pedal edema Psych: Pleasant affect, spontaneous speech Skin: No rashes skin warm and dry.     Assessment & Plan:   Encounter to establish care  Acquired hypothyroidism Assessment & Plan: Currently takes 200 mcg levothyroxine.  Do not see any recent TSH level so we will order repeat TSH  Orders: -     TSH; Future -     CBC; Future  Class 1 obesity due to excess calories  without serious comorbidity with body mass index (BMI) of 34.0 to 34.9 in adult -     Lipid panel; Future -     Comprehensive metabolic panel; Future -     Hemoglobin A1c; Future  Panic disorder without agoraphobia Assessment & Plan: Patient currently being prescribed Xanax as needed up to 3 times daily but takes it less often for panic disorder/anxiety.  Patient expresses desire to eventually wean off of this medication.  With the recent illness of her husband and mother, not a good time right  now to wean off.  Has tried other medications for anxiety in the past including SSRI, SNRI bupropion.  These were ineffective.     Return in about 3 months (around 07/11/2023) for anxiety.   Sandre Kitty, MD

## 2023-04-11 ENCOUNTER — Ambulatory Visit: Payer: Medicaid Other | Admitting: Family Medicine

## 2023-04-15 ENCOUNTER — Other Ambulatory Visit: Payer: Medicaid Other

## 2023-04-15 DIAGNOSIS — E039 Hypothyroidism, unspecified: Secondary | ICD-10-CM | POA: Diagnosis not present

## 2023-04-15 DIAGNOSIS — E66811 Obesity, class 1: Secondary | ICD-10-CM | POA: Diagnosis not present

## 2023-04-15 DIAGNOSIS — Z6834 Body mass index (BMI) 34.0-34.9, adult: Secondary | ICD-10-CM | POA: Diagnosis not present

## 2023-04-15 DIAGNOSIS — E6609 Other obesity due to excess calories: Secondary | ICD-10-CM | POA: Diagnosis not present

## 2023-04-16 ENCOUNTER — Other Ambulatory Visit: Payer: Self-pay | Admitting: Family Medicine

## 2023-04-16 ENCOUNTER — Telehealth: Payer: Self-pay | Admitting: *Deleted

## 2023-04-16 DIAGNOSIS — F411 Generalized anxiety disorder: Secondary | ICD-10-CM | POA: Diagnosis not present

## 2023-04-16 LAB — COMPREHENSIVE METABOLIC PANEL
ALT: 26 [IU]/L (ref 0–32)
AST: 24 [IU]/L (ref 0–40)
Albumin: 4.6 g/dL (ref 3.9–4.9)
Alkaline Phosphatase: 81 [IU]/L (ref 44–121)
BUN/Creatinine Ratio: 16 (ref 9–23)
BUN: 12 mg/dL (ref 6–24)
Bilirubin Total: 0.3 mg/dL (ref 0.0–1.2)
CO2: 19 mmol/L — ABNORMAL LOW (ref 20–29)
Calcium: 9.7 mg/dL (ref 8.7–10.2)
Chloride: 103 mmol/L (ref 96–106)
Creatinine, Ser: 0.77 mg/dL (ref 0.57–1.00)
Globulin, Total: 2.6 g/dL (ref 1.5–4.5)
Glucose: 90 mg/dL (ref 70–99)
Potassium: 4.8 mmol/L (ref 3.5–5.2)
Sodium: 140 mmol/L (ref 134–144)
Total Protein: 7.2 g/dL (ref 6.0–8.5)
eGFR: 96 mL/min/{1.73_m2} (ref >59)

## 2023-04-16 LAB — LIPID PANEL
Chol/HDL Ratio: 7.7 ratio — ABNORMAL HIGH (ref 0.0–4.4)
Cholesterol, Total: 338 mg/dL — ABNORMAL HIGH (ref 100–199)
HDL: 44 mg/dL (ref >39)
LDL Chol Calc (NIH): 213 mg/dL — ABNORMAL HIGH (ref 0–99)
Triglycerides: 385 mg/dL — ABNORMAL HIGH (ref 0–149)
VLDL Cholesterol Cal: 81 mg/dL — ABNORMAL HIGH (ref 5–40)

## 2023-04-16 LAB — CBC
Hematocrit: 44.1 % (ref 34.0–46.6)
Hemoglobin: 14.9 g/dL (ref 11.1–15.9)
MCH: 33.5 pg — ABNORMAL HIGH (ref 26.6–33.0)
MCHC: 33.8 g/dL (ref 31.5–35.7)
MCV: 99 fL — ABNORMAL HIGH (ref 79–97)
Platelets: 405 10*3/uL (ref 150–450)
RBC: 4.45 x10E6/uL (ref 3.77–5.28)
RDW: 11.5 % — ABNORMAL LOW (ref 11.7–15.4)
WBC: 9.1 10*3/uL (ref 3.4–10.8)

## 2023-04-16 LAB — HEMOGLOBIN A1C
Est. average glucose Bld gHb Est-mCnc: 108 mg/dL
Hgb A1c MFr Bld: 5.4 % (ref 4.8–5.6)

## 2023-04-16 LAB — TSH: TSH: 7.22 u[IU]/mL — ABNORMAL HIGH (ref 0.450–4.500)

## 2023-04-16 MED ORDER — ALPRAZOLAM 1 MG PO TABS: 1.0000 mg | ORAL_TABLET | Freq: Three times a day (TID) | ORAL | 0 refills | Status: DC | PRN

## 2023-04-16 NOTE — Telephone Encounter (Signed)
Refill was sent in 

## 2023-04-16 NOTE — Telephone Encounter (Signed)
Pt returning call, I gave her the below information and she said she would like to try and lower her cholesterol on her own.  She is also requesting a refill on Xanax.  She said that her previous provider who is no longer in network was filling this.  She would like this to [facility] to [facility] [place].    I tried to call you to discuss your results with you.  I was unable to leave a voicemail.   Your TSH levels are slightly elevated.  Please make sure you are taking your thyroid medication every day and taking it in the morning, on an empty stomach, with a full glass of water, and avoid eating or drinking anything for 30 minutes afterwards.   Your total cholesterol and LDL cholesterol are significantly elevated.  When these values are as high as yours were, it is generally recommended to start a cholesterol medication regardless of other risk factors.  I would like to start a medication called Crestor and recheck your cholesterol levels in about 2 months.  If you would like to discuss it in person we can schedule an appointment.  Please let me know if you would need to send in the medication.   Otherwise your tests were normal

## 2023-04-16 NOTE — Telephone Encounter (Signed)
Pt informed of below.  

## 2023-04-25 DIAGNOSIS — F411 Generalized anxiety disorder: Secondary | ICD-10-CM | POA: Diagnosis not present

## 2023-04-30 IMAGING — US US THYROID
1 series · 13 of 24 positions shown · non-contrast
Comparison: None.

CLINICAL DATA: Other. History of thyroidectomy secondary degree
Graves disease in 8771.

EXAM:
THYROID ULTRASOUND
TECHNIQUE: Ultrasound examination of the thyroid gland and adjacent soft
tissues was performed.

[Series 1: us thyroid · 0.07mm/px · 24 acquisitions, 13 frames shown]
[im 1/24]
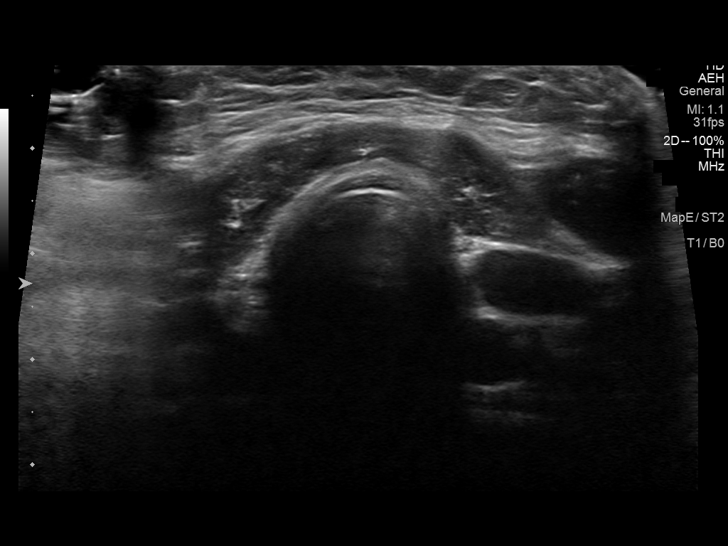
[im 3/24]
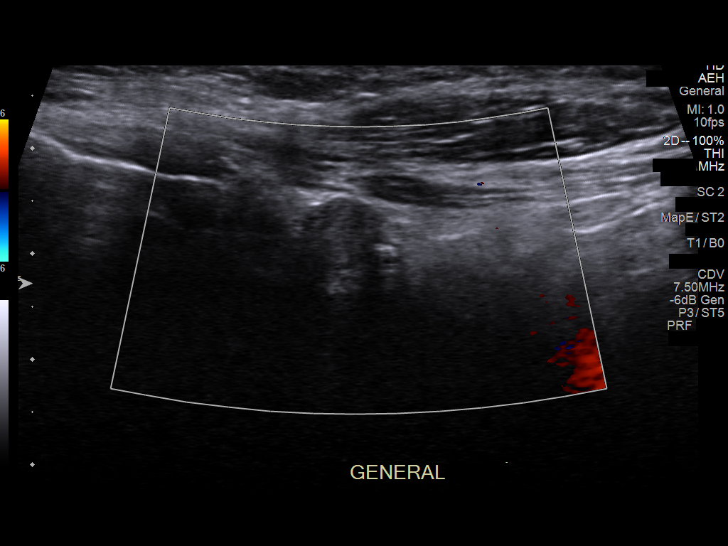
[im 5/24]
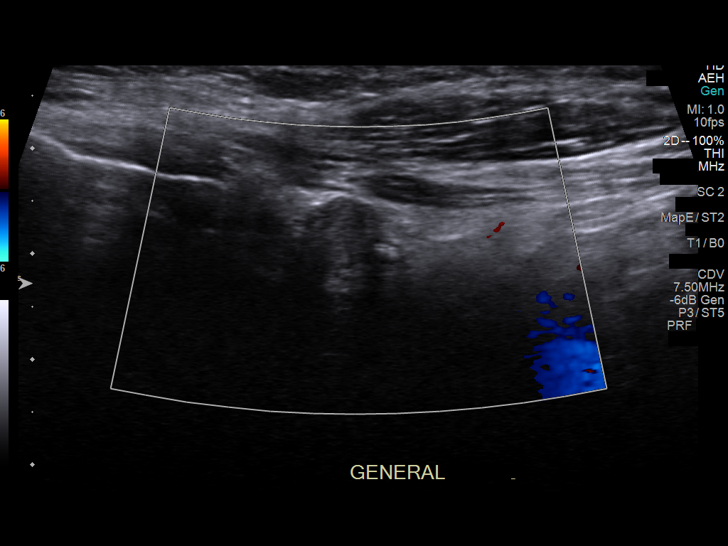
[im 7/24]
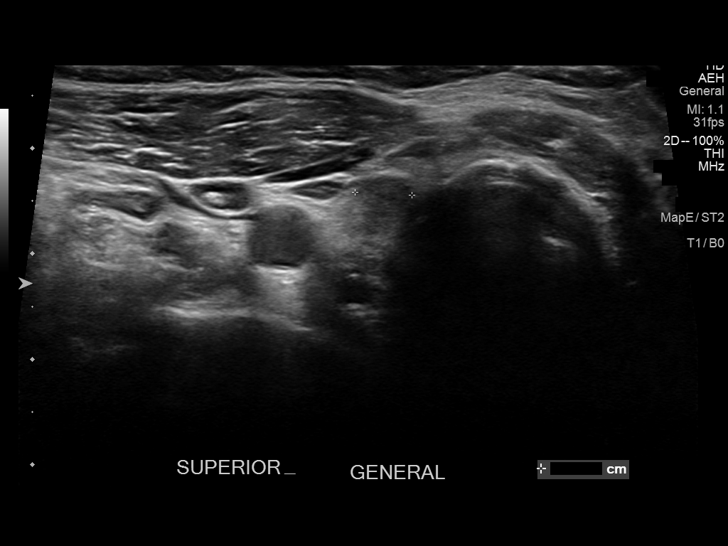
[im 9/24]
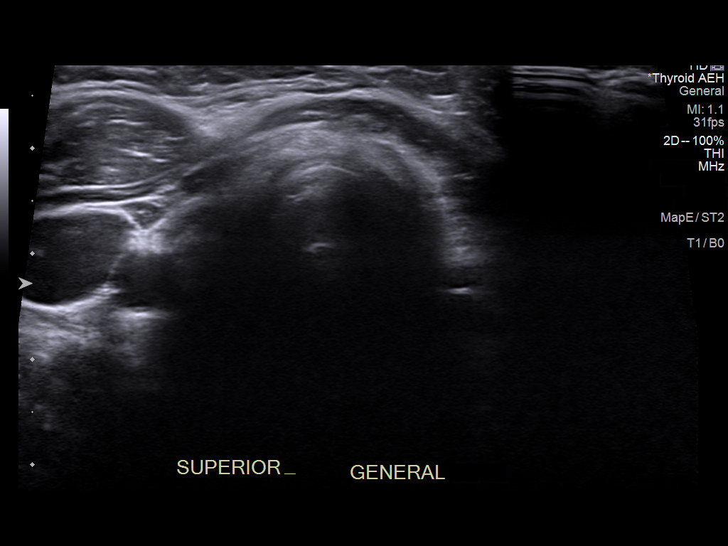
[im 11/24]
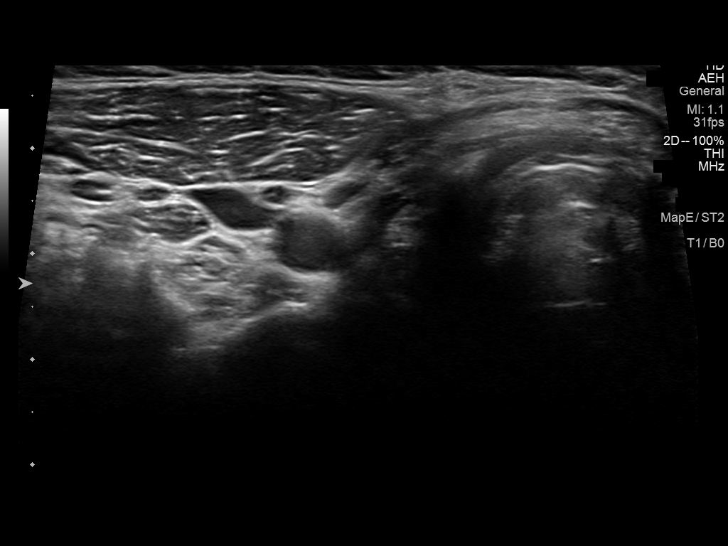
[im 13/24]
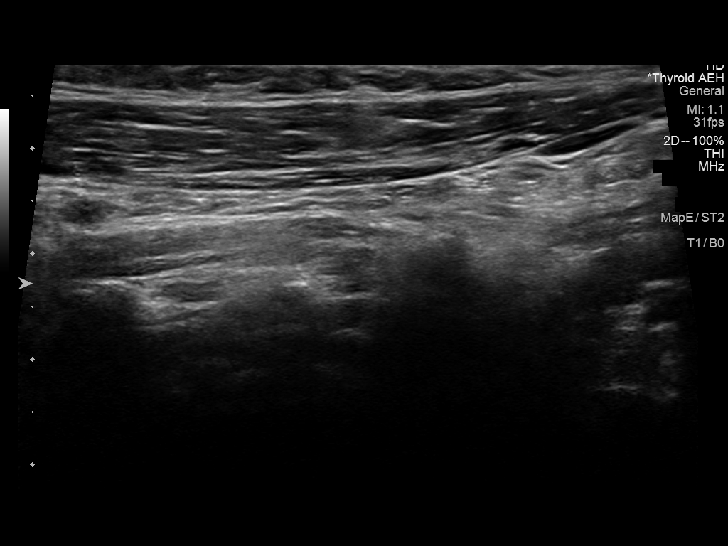
[im 14/24]
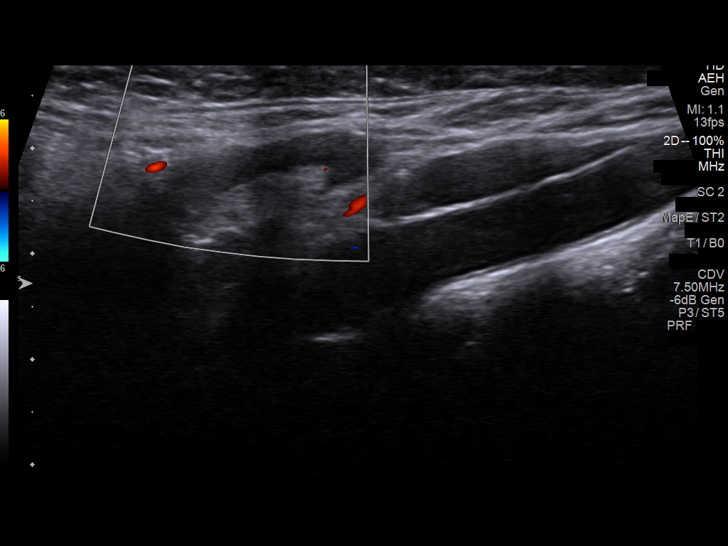
[im 16/24]
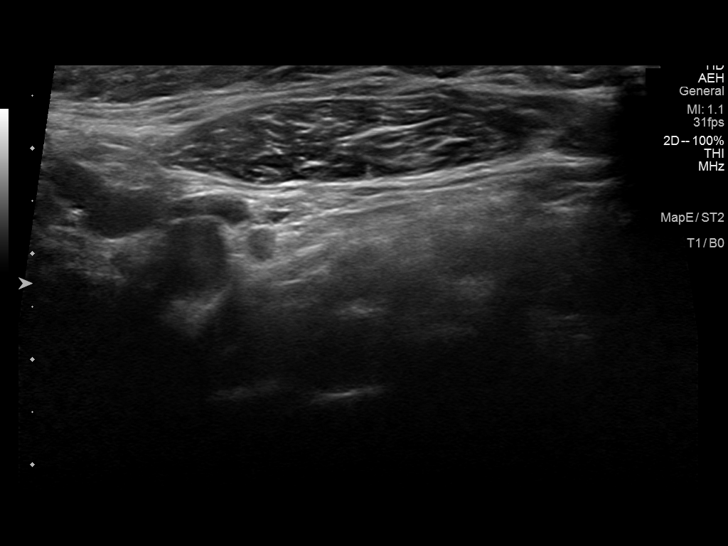
[im 18/24]
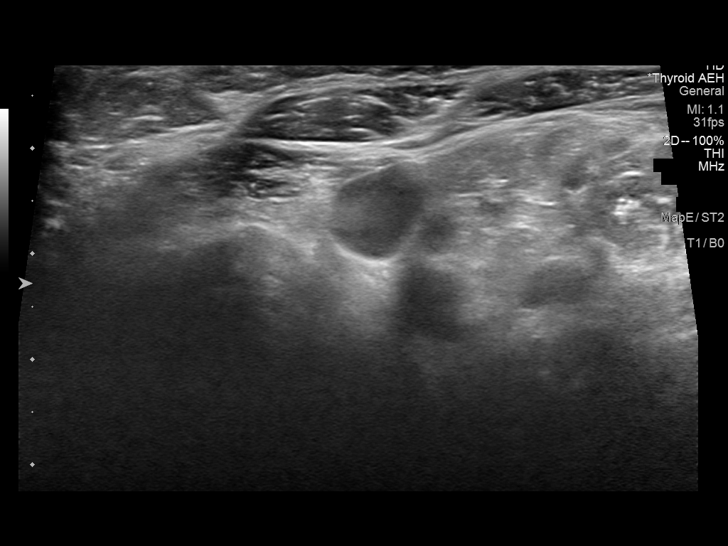
[im 20/24]
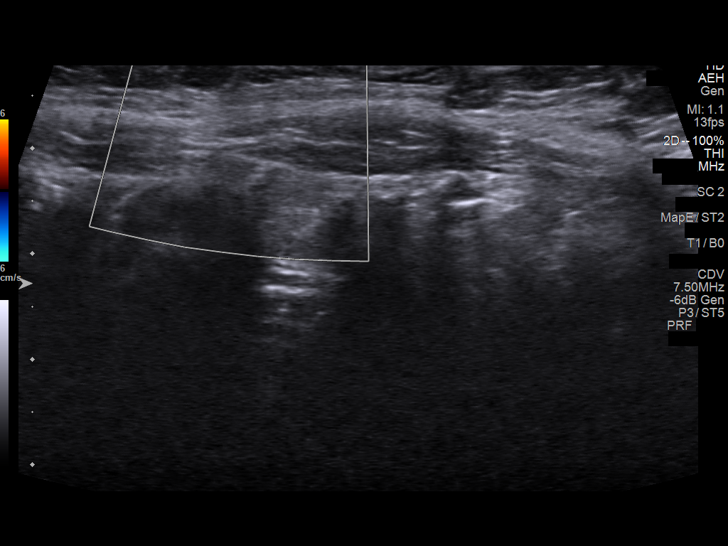
[im 22/24]
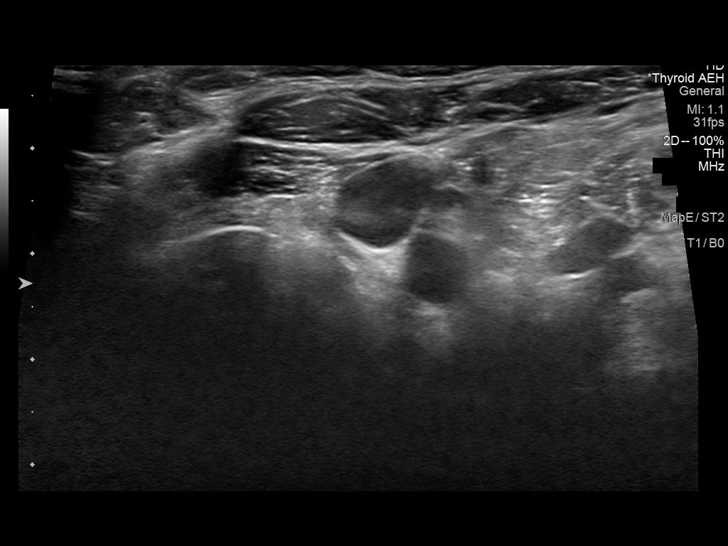
[im 24/24]
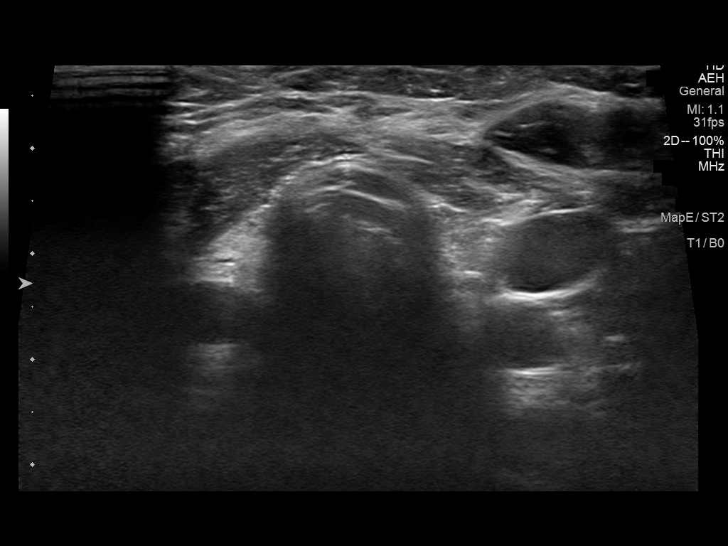

[13 of 24 positions shown; findings below may reference images not displayed]

FINDINGS: Isthmus: Surgically absent. There is no residual nodular soft tissue
within the isthmic resection bed.

Right lobe: There is an approximately 0.8 x 0.7 x 0.5 cm hypoechoic
nodule within the right thyroidectomy resection bed.

Left lobe: Surgically absent. There is no residual nodular soft
tissue within the left lobectomy resection bed.

_________________________________________________________

Note is made of a mildly prominent though non pathologically
enlarged left cervical lymph node which measures 0.9 cm in greatest
short axis diameter and maintains a benign fatty hila, presumably
reactive in etiology. No bulky cervical lymphadenopathy.
IMPRESSION: Post total thyroidectomy with an approximately 0.8 cm hypoechoic
nodule within the right thyroidectomy resection bed, nonspecific
with broad differential considerations including residual thyroid
parenchyma, a non pathologically enlarged cervical lymph node or a
parathyroid adenoma. Correlation with thyroglobulin levels could be
performed as indicated. If there is concern for a parathyroid
adenoma, further evaluation with nuclear medicine parathyroid
scintigraphy can be performed as indicated.

## 2023-05-07 DIAGNOSIS — F411 Generalized anxiety disorder: Secondary | ICD-10-CM | POA: Diagnosis not present

## 2023-05-12 DIAGNOSIS — F411 Generalized anxiety disorder: Secondary | ICD-10-CM | POA: Diagnosis not present

## 2023-05-21 DIAGNOSIS — F411 Generalized anxiety disorder: Secondary | ICD-10-CM | POA: Diagnosis not present

## 2023-05-21 DIAGNOSIS — N946 Dysmenorrhea, unspecified: Secondary | ICD-10-CM | POA: Diagnosis not present

## 2023-06-12 DIAGNOSIS — F411 Generalized anxiety disorder: Secondary | ICD-10-CM | POA: Diagnosis not present

## 2023-06-17 DIAGNOSIS — R3911 Hesitancy of micturition: Secondary | ICD-10-CM | POA: Diagnosis not present

## 2023-06-17 DIAGNOSIS — R300 Dysuria: Secondary | ICD-10-CM | POA: Diagnosis not present

## 2023-06-17 DIAGNOSIS — R1032 Left lower quadrant pain: Secondary | ICD-10-CM | POA: Diagnosis not present

## 2023-07-02 DIAGNOSIS — F411 Generalized anxiety disorder: Secondary | ICD-10-CM | POA: Diagnosis not present

## 2023-07-11 ENCOUNTER — Ambulatory Visit: Payer: Self-pay | Admitting: Family Medicine

## 2023-07-11 ENCOUNTER — Ambulatory Visit: Payer: Medicaid Other | Admitting: Family Medicine

## 2023-07-11 NOTE — Progress Notes (Deleted)
   Established Patient Office Visit  Subjective   Patient ID: Terri Lopez, female    DOB: 30-Apr-1976  Age: 48 y.o. MRN: [id number]  No chief complaint on file.   HPI  Anxiety-mother ill and husband diagnosed with throat cancer.  2 teenagers.  Continues to see therapist.  Hyperlipidemia-needs to be on a statin.     The ASCVD Risk score (Arnett DK, et al., 2019) failed to calculate for the following reasons:   The valid total cholesterol range is 130 to 320 mg/dL  Health Maintenance Due  Topic Date Due   Pneumococcal Vaccine 76-35 Years old (1 of 2 - PCV) Never done   Hepatitis C Screening  Never done   Cervical Cancer Screening (HPV/Pap Cotest)  11/05/2009   Colonoscopy  Never done   COVID-19 Vaccine (1 - 2024-25 season) Never done      Objective:     There were no vitals taken for this visit. {Vitals History (Optional):23777}  Physical Exam   No results found for any visits on 07/11/23.      Assessment & Plan:   There are no diagnoses linked to this encounter.   No follow-ups on file.    Toribio MARLA Slain, MD

## 2023-07-11 NOTE — Telephone Encounter (Signed)
  Reason for Disposition  Cough  Answer Assessment - Initial Assessment Questions 1. ONSET: When did the cough begin?      Earlier this week  2. SEVERITY: How bad is the cough today?      Mild  3. DIFFICULTY BREATHING: Are you having difficulty breathing? If Yes, ask: How bad is it? (e.g., mild, moderate, severe)    - MILD: No SOB at rest, mild SOB with walking, speaks normally in sentences, can lie down, no retractions, pulse < 100.    - MODERATE: SOB at rest, SOB with minimal exertion and prefers to sit, cannot lie down flat, speaks in phrases, mild retractions, audible wheezing, pulse 100-120.    - SEVERE: Very SOB at rest, speaks in single words, struggling to breathe, sitting hunched forward, retractions, pulse > 120      No  4. FEVER: Do you have a fever? If Yes, ask: What is your temperature, how was it measured, and when did it start?     No  5. OTHER SYMPTOMS: Do you have any other symptoms? (e.g., runny nose, wheezing, chest pain)   congestion  Protocols used: Cough - Acute Non-Productive-A-AH   Patient called and stated she had a follow up appointment scheduled for today, but she forgot about it and missed the appointment. She also mentioned that she has a mild cough and congestion that started earlier this week. Patient is doing home remedies and does not want to be seen for these symptoms. She only called to reschedule the follow up visit that she missed. Next available appointment with Dr. Chandra has been scheduled.

## 2023-07-28 DIAGNOSIS — F411 Generalized anxiety disorder: Secondary | ICD-10-CM | POA: Diagnosis not present

## 2023-08-19 ENCOUNTER — Ambulatory Visit: Payer: Medicaid Other | Admitting: Family Medicine

## 2023-08-26 DIAGNOSIS — N857 Hematometra: Secondary | ICD-10-CM | POA: Diagnosis not present

## 2023-08-26 DIAGNOSIS — R102 Pelvic and perineal pain: Secondary | ICD-10-CM | POA: Diagnosis not present

## 2023-08-26 DIAGNOSIS — T50905A Adverse effect of unspecified drugs, medicaments and biological substances, initial encounter: Secondary | ICD-10-CM | POA: Diagnosis not present

## 2023-08-27 ENCOUNTER — Other Ambulatory Visit: Payer: Self-pay | Admitting: Obstetrics and Gynecology

## 2023-08-28 DIAGNOSIS — N946 Dysmenorrhea, unspecified: Secondary | ICD-10-CM | POA: Diagnosis not present

## 2023-09-02 DIAGNOSIS — F411 Generalized anxiety disorder: Secondary | ICD-10-CM | POA: Diagnosis not present

## 2023-09-16 DIAGNOSIS — F411 Generalized anxiety disorder: Secondary | ICD-10-CM | POA: Diagnosis not present

## 2023-10-01 ENCOUNTER — Encounter: Payer: Self-pay | Admitting: Family Medicine

## 2023-10-01 ENCOUNTER — Ambulatory Visit: Admitting: Family Medicine

## 2023-10-01 VITALS — BP 114/80 | HR 108 | Ht 62.0 in | Wt 194.1 lb

## 2023-10-01 DIAGNOSIS — L989 Disorder of the skin and subcutaneous tissue, unspecified: Secondary | ICD-10-CM

## 2023-10-01 DIAGNOSIS — E7849 Other hyperlipidemia: Secondary | ICD-10-CM

## 2023-10-01 DIAGNOSIS — G44229 Chronic tension-type headache, not intractable: Secondary | ICD-10-CM | POA: Diagnosis not present

## 2023-10-01 DIAGNOSIS — F410 Panic disorder [episodic paroxysmal anxiety] without agoraphobia: Secondary | ICD-10-CM

## 2023-10-01 DIAGNOSIS — N809 Endometriosis, unspecified: Secondary | ICD-10-CM | POA: Diagnosis not present

## 2023-10-01 DIAGNOSIS — F411 Generalized anxiety disorder: Secondary | ICD-10-CM | POA: Diagnosis not present

## 2023-10-01 DIAGNOSIS — E039 Hypothyroidism, unspecified: Secondary | ICD-10-CM

## 2023-10-01 MED ORDER — BUTALBITAL-APAP-CAFFEINE 50-325-40 MG PO TABS: 1.0000 | ORAL_TABLET | Freq: Four times a day (QID) | ORAL | 0 refills | Status: AC | PRN

## 2023-10-01 NOTE — Progress Notes (Signed)
   Established Patient Office Visit  Subjective   Patient ID: Terri Lopez, female    DOB: 22-Jun-1975  Age: 48 y.o. MRN: [id number]  Chief Complaint  Patient presents with   Medical Management of Chronic Issues    HPI  Subjective: - Headache for 3 days - Started on Depo-Lupron injection by gynecologist for endometriosis.  - Reports puffiness, water retention, unable to wear rings - Scheduled for hysterectomy on 11/06/2023, ovaries likely to be preserved - History of abnormal cell growth in fallopian tubes found during C-section/tubal ligation - Difficulty sleeping - Vision changes - trouble reading text and clock, may need bifocals - Skin lesion on leg - started as freckle during pregnancy with son, has raised since then - Reports family history of high cholesterol   The ASCVD Risk score (Arnett DK, et al., 2019) failed to calculate for the following reasons:   The valid total cholesterol range is 130 to 320 mg/dL  Health Maintenance Due  Topic Date Due   Hepatitis C Screening  Never done   Pneumococcal Vaccine 9-48 Years old (1 of 2 - PCV) Never done   Cervical Cancer Screening (HPV/Pap Cotest)  11/05/2009   Colonoscopy  Never done   COVID-19 Vaccine (1 - 2024-25 season) Never done      Objective:     BP 114/80   Pulse (!) 108   Ht 5\' 2"  (1.575 m)   Wt 194 lb 1.3 oz (88 kg)   SpO2 98%   BMI 35.50 kg/m    Physical Exam  General: Alert, oriented CV: Regular rhythm Pulmonary: Lungs clear bilaterally Psych: Pleasant affect.   No results found for any visits on 10/01/23.      Assessment & Plan:   Other hyperlipidemia Assessment & Plan: LDL greater than 200.  Patient wants to try and improve it with diet before starting a statin.  Has a family history of intolerance to statins.  With elevations this high it is likely hereditary and dietary and lifestyle modifications alone may not be enough to fix cholesterol levels completely. - Will get future repeat  of lipid panel, and LPA and APO B as well.  Orders: -     Lipid panel; Future -     Apolipoprotein B; Future -     Lipoprotein A (LPA); Future  Chronic tension-type headache, not intractable Assessment & Plan: Worsening lately.  Will send in refill of Fioricet.     Panic disorder without agoraphobia Assessment & Plan: Continue xanax  prn tid   Skin lesion Assessment & Plan: Small circular skin lesion on ankle seems most consistent with fibroma.  Pt is okay with punch biopsy here at future visit.     Endometriosis Assessment & Plan: Currently gettting lupron injections.  Has hysterectomy scheduled for next month   Other orders -     Butalbital -APAP-Caffeine ; Take 1 tablet by mouth every 6 (six) hours as needed for headache.  Dispense: 14 tablet; Refill: 0     Return in about 2 months (around 12/01/2023) for hldm thyroid , punch biopsy.    Laneta Pintos, MD

## 2023-10-01 NOTE — Patient Instructions (Signed)
 It was nice to see you today,  We addressed the following topics today: At your next visit in June we can do a punch biopsy of your spot on your ankle. - I would like to check your labs in the next week and adjust any medications if necessary - Your cholesterol is significantly elevated.  It is likely you will need cholesterol medicine even if you adjust your diet, but you should try to limit your saturated fat from meat and dairy products specifically.  Try to limit to  less than 10 g/day.  Have a great day,  Etha Henle, MD

## 2023-10-02 DIAGNOSIS — E785 Hyperlipidemia, unspecified: Secondary | ICD-10-CM | POA: Insufficient documentation

## 2023-10-02 DIAGNOSIS — L989 Disorder of the skin and subcutaneous tissue, unspecified: Secondary | ICD-10-CM | POA: Insufficient documentation

## 2023-10-02 DIAGNOSIS — G8929 Other chronic pain: Secondary | ICD-10-CM | POA: Insufficient documentation

## 2023-10-02 NOTE — Assessment & Plan Note (Signed)
 Currently gettting lupron injections.  Has hysterectomy scheduled for next month

## 2023-10-02 NOTE — Assessment & Plan Note (Signed)
 LDL greater than 200.  Patient wants to try and improve it with diet before starting a statin.  Has a family history of intolerance to statins.  With elevations this high it is likely hereditary and dietary and lifestyle modifications alone may not be enough to fix cholesterol levels completely. - Will get future repeat of lipid panel, and LPA and APO B as well.

## 2023-10-02 NOTE — Assessment & Plan Note (Signed)
 Worsening lately.  Will send in refill of Fioricet.

## 2023-10-02 NOTE — Assessment & Plan Note (Signed)
 Small circular skin lesion on ankle seems most consistent with fibroma.  Pt is okay with punch biopsy here at future visit.

## 2023-10-02 NOTE — Assessment & Plan Note (Signed)
 Continue xanax  prn tid

## 2023-10-06 NOTE — Addendum Note (Signed)
 Addended by: ZIMMERMAN RUMPLE, Valerio Pinard D on: 10/06/2023 05:29 PM   Modules accepted: Orders

## 2023-10-07 ENCOUNTER — Other Ambulatory Visit

## 2023-10-07 DIAGNOSIS — E039 Hypothyroidism, unspecified: Secondary | ICD-10-CM

## 2023-10-07 DIAGNOSIS — E7849 Other hyperlipidemia: Secondary | ICD-10-CM

## 2023-10-09 LAB — TSH: TSH: 2.13 u[IU]/mL (ref 0.450–4.500)

## 2023-10-09 LAB — LIPID PANEL
Chol/HDL Ratio: 6.1 ratio — ABNORMAL HIGH (ref 0.0–4.4)
Cholesterol, Total: 293 mg/dL — ABNORMAL HIGH (ref 100–199)
HDL: 48 mg/dL (ref >39)
LDL Chol Calc (NIH): 183 mg/dL — ABNORMAL HIGH (ref 0–99)
Triglycerides: 316 mg/dL — ABNORMAL HIGH (ref 0–149)
VLDL Cholesterol Cal: 62 mg/dL — ABNORMAL HIGH (ref 5–40)

## 2023-10-09 LAB — LIPOPROTEIN A (LPA): Lipoprotein (a): 15.6 nmol/L (ref <75.0)

## 2023-10-09 LAB — APOLIPOPROTEIN B: Apolipoprotein B: 177 mg/dL — ABNORMAL HIGH (ref <90)

## 2023-10-13 DIAGNOSIS — F411 Generalized anxiety disorder: Secondary | ICD-10-CM | POA: Diagnosis not present

## 2023-10-14 DIAGNOSIS — F411 Generalized anxiety disorder: Secondary | ICD-10-CM | POA: Diagnosis not present

## 2023-10-17 ENCOUNTER — Ambulatory Visit: Payer: Self-pay | Admitting: Family Medicine

## 2023-10-21 DIAGNOSIS — Z9889 Other specified postprocedural states: Secondary | ICD-10-CM | POA: Diagnosis not present

## 2023-10-21 DIAGNOSIS — N857 Hematometra: Secondary | ICD-10-CM | POA: Diagnosis not present

## 2023-10-21 DIAGNOSIS — Z8719 Personal history of other diseases of the digestive system: Secondary | ICD-10-CM | POA: Diagnosis not present

## 2023-10-21 DIAGNOSIS — R102 Pelvic and perineal pain: Secondary | ICD-10-CM | POA: Diagnosis not present

## 2023-10-22 NOTE — H&P (Signed)
 Terri Lopez is a 48 y.o. female, P: 2-0-1-2 who presents for hysterectomy because of chronic pelvic pain and hematometra. The patient underwent a Novasure endometrial ablation for menorrhagia 08/31/2021 (endometrial curettage returned benign findings). Five months post op,  the patient reported pelvic pain that was persistent and required Ketorolac  for management. A pelvic ultrasound in August of 2023 revealed a uterine volume-126 cc, endometrium-5.06 mm with an anterior myometrial avascular cyst of unknown etiology but suggestive of a hematometra. Patient would periodically pass moderate amounts of brown liquid that would temporarily decrease her pain, from  7/10 to 2/10. There were times, however, she had to visit the emergency department to achieve adequate pain relief . Given the apparent accumulation of fluid within her endometrium, consideration was given to placing the patient  on norethindrone or progesterone however neither therapy was helpful. Consequently the patient was placed on Lupron Depot 11.25 mg every 90 days totaling 3 doses starting September 2023.  A followup ultrasound in October 2023 was essentially unchanged and CT scan of abdomen and pelvis at that same time was unremarkable except for non-obstructing renal stones. Throughout her evaluation the patient's thyroid  test, CBC and Comprehensive Metabolic Panel have been normal.  With the initiation of Lupron Depot the patient has not had any further bleeding and minimal pain. She denies any changes in bowel function or vaginitis symptoms but does admit to a sensation of incomplete bladder emptying, dyspareunia and vaginal dryness (attributed to Lupron Depot).  Given the protracted history of uterine dysfunction and her recent challenges post ablation the patient wishes to proceed with definitive management in the form  of hysterectomy.   Past Medical History  OB History: G: 3; P: 2-0-1-2: C-section: 2014 and 2016  GYN History: menarche:  48 YO;    LMP: see HPI; Contraception Tubal Sterilzation;  Remote  history of abnormal PAP smear treated with cryotherapy-normal since;  Last PAP smear: 2021-normal  Medical History: Anxiety, Graves Disease, Renal Stone, Vitamin D Deficiency,  and Hyperlipidemia  Surgical History: 2023: Novasure Endometrial Ablation; 2018: Umbilical Hernia Repair with Mesh; 2016: C-section and Tubal Sterilization; 2014: C-section and 2006: Thyroidectomy Denies problems with anesthesia or history of blood transfusions  Family History: Breast Cancer, Ovarian Cancer, Colon Polyps, Diabetes Mellitus, Alzheimers, Colon Cancer, Vulvar Cancer, Melanoma, Aneurysm, COPD, Peripheral Artery Disease and Valvular Heart Disease.  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   ROS: Admits to bilateral tinnitus, occasional loose stools,  occasional headache and lower back/hip pain.  Denies vision changes, nasal congestion, dysphagia, dizziness, hoarseness, cough,  chest pain, shortness of breath, nausea, vomiting, diarrhea,constipation,  urinary frequency, urgency  dysuria, hematuria, vaginitis symptoms, pelvic pain, swelling of joints,easy bruising, skin rashes, unexplained weight loss and except as is mentioned in the history of present illness, patient's review of systems is otherwise negative.   Physical Exam  Bp: 124/86;  Weight:197.2 lbs.; Height:  5'3";  BMI: 34.9  Neck: supple without masses or thyromegaly Lungs: clear to auscultation Heart: regular rate and rhythm Abdomen: soft, non-tender and no organomegaly Pelvic:EGBUS- wnl; vagina-normal rugae; uterus-normal size, tender,  cervix without lesions or motion tenderness; adnexae-bilateral tenderness or masses Extremities:  no clubbing, cyanosis or edema   Assesment: Chronic Pelvic Pain                      Hematometra                      S/P Endometrial Ablation   Disposition:  The robot-assisted hysterectomy was reviewed with the patient along with the indication for her procedure. Benefits of the robotic approach include lesser postoperative pain, less blood loss during surgery, reduced risk of injury to other organs, due to better visualization with a 3-D HD 10 times magnifying camera; shorter hospital stay between 0-1 night and rapid recovery with return to daily routine in 2-3 weeks (however, nothing in the vagina for 6 weeks). Although the robot-assisted hysterectomy has a longer operative time than traditional laparotomy, the benefits usually outweigh the risks, in a patient with good medical history,   Risks include bleeding, infection, injury to other organs (bladder more vulnerable if previous cesarean delivery) , need for laparotomy, transient post-operative facial edema, increased risk of pelvic prolapse (associated with any hysterectomy) as well as earlier onset of menopause. Preservation of the ovaries was also reviewed and recommended. Finally, we recommended bilateral salpingectomy for lifelong reduction of ovarian cancer. A Miralax Bowel Prep was given to the patient to complete the day before her surgery.  The patient's questions were answered and she verbalized understanding of the risks and pre-operative instructions. The patient has consented to proceed with a Robot Assisted Laparoscopic Hysterectomy with Bilateral Salpingectomy at [facility] on November 06, 2023.    CSN# [id number]   Annelisa Ryback J. [place], PA-C  for Dr. Ulysess Gang. Rivard

## 2023-10-30 ENCOUNTER — Encounter (HOSPITAL_COMMUNITY): Payer: Self-pay | Admitting: Obstetrics and Gynecology

## 2023-10-30 DIAGNOSIS — F411 Generalized anxiety disorder: Secondary | ICD-10-CM | POA: Diagnosis not present

## 2023-10-30 NOTE — Progress Notes (Signed)
 Spoke w/ via phone for pre-op interview--- pt Lab needs dos----  urine preg       Lab results------ lab appt 11-03-2023 @ 1100 getting CBC/ T&S COVID test -----patient states asymptomatic no test needed Arrive at ------- 0530 on 11-05-2023 NPO after MN NO Solid Food.  Clear liquids from MN until--- 0430 Pre-Surgery Ensure or G2:  Ensure x3 ERAS protocol:  yes  Med rec completed Medications to take morning of surgery ----- synthroid , xanax , zantac Diabetic medication ----- n/a  GLP1 agonist last dose: n/a GLP1 instructions:  Patient instructed no nail polish to be worn day of surgery Patient instructed to bring photo id and insurance card day of surgery Patient aware to have Driver (ride ) / caregiver    for 24 hours after surgery - husband, Terri Lopez Patient Special Instructions ----- will pick up bag w/ soap and written instructions at lab appt Pre-Op special Instructions ----- n/a  Patient verbalized understanding of instructions that were given at this phone interview. Patient denies chest pain, sob, fever, cough at the interview.

## 2023-10-30 NOTE — Pre-Procedure Instructions (Addendum)
 Surgical Instructions   Your procedure is scheduled on :  Thursday,  11-05-2023. Report to [facility] Main Entrance "A" at 5:30  A.M., then check in with the Admitting office. Any questions or running late day of surgery: call [phone]  Questions prior to your surgery date: call [phone], Monday-Friday, 8am-4pm. If you experience any cold or flu symptoms such as cough, fever, chills, shortness of breath, etc. between now and your scheduled surgery, please notify your surgeon office.     ENHANCED RECOVERY AFTER SURGERY (ERAS)           (The drinks can be refrigerated)         Remember:        Drink 2 pre-surgery Ensure carbohydrate drinks night before surgery         at 10:00  PM.             (If you do not like the drink, or making stomach uneasy, or nausea; stop              Drinking.  But continue with drinking and after midnight with clear liquids             To stay hydrated.)    Do not eat any food after midnight the night before your surgery.        You may have clear liquids from midnight before surgery until 4:30 AM.         Morning of surgery At 4:00 AM drink 1 Ensure pre-surgery carbohydrate drink Ensure needs to be completed at 4:30 AM.   You may drink clear liquids from midnight night before surgery until 4:30 AM the morning of your surgery.   Clear liquids allowed are:  Water Carbonated Beverages Clear Tea (may sweeten, no milk, honey, etc. Black Coffee Only (may sweeten, NO MILK, CREAM OR POWDERED CREAMER of any kind) Sport drinks, like Gatorade.  NO clear liquids after 4:30 AM day of surgery.  This includes no water,  candy,  gum,  and  mints.    Take these medicines the morning of surgery with A SIPS OF WATER :  may have after 4:30 AM with sips of water Levothyroxine  (synthroid ) Alprazolam  (xanax ) Ranitidine (zantac)   May take these medicines IF NEEDED: Butalbital -acetaminophen -caffeine  (fioricet) Methocarbamol (robaxin) Ondansetron   (zofran ) Fexofenadine (allegra)    One week prior to surgery, STOP taking any Aspirin (unless otherwise instructed by your surgeon) Aleve, Naproxen, Ibuprofen , Motrin , Advil , Goody's, BC's, all herbal medications, fish oil, and non-prescription vitamins.                     Do NOT Smoke (Tobacco/Vaping) and Do Not drink alcohol for 24 hours prior to your procedure.  If you use a CPAP at night, you may bring your mask/headgear for your overnight stay.   You will be asked to remove any contacts, glasses, piercing's, hearing aid's, dentures/partials prior to surgery. Please bring cases for these items if needed.    Patients discharged the day of surgery will not be allowed to drive home, and someone needs to stay with them for 24 hours.  SURGICAL WAITING ROOM VISITATION Patients may have no more than 2 support people in the waiting area - these visitors may rotate.   Pre-op nurse will coordinate an appropriate time for 1 ADULT support person, who may not rotate, to accompany patient in pre-op.  Children under the age of 26 must have an adult with them who is not the patient  and must remain in the main waiting area with an adult.  If the patient needs to stay at the hospital during part of their recovery, the visitor guidelines for inpatient rooms apply.  Please refer to the [facility] website for the visitor guidelines for any additional information.   If you received a COVID test during your pre-op visit  it is requested that you wear a mask when out in public, stay away from anyone that may not be feeling well and notify your surgeon if you develop symptoms. If you have been in contact with anyone that has tested positive in the last 10 days please notify you surgeon.      Pre-operative CHG Bathing Instructions   You can play a key role in reducing the risk of infection after surgery. Your skin needs to be as free of germs as possible. You can reduce the number of germs on your skin  by washing with CHG (chlorhexidine gluconate) soap before surgery. CHG is an antiseptic soap that kills germs and continues to kill germs even after washing.   DO NOT use if you have an allergy to chlorhexidine/CHG or antibacterial soaps. If your skin becomes reddened or irritated, stop using the CHG and notify Pre-nurse day of surgery.  ~~ Please get dial soap or other antibacterial soap ( no scent) and shower following the instructions below.             TAKE A SHOWER THE NIGHT BEFORE SURGERY AND THE DAY OF SURGERY    Please keep in mind the following:  DO NOT shave, including legs and underarms, 48 hours prior to surgery.   You may shave your face before/day of surgery.  Place clean sheets on your bed the night before surgery Use a clean washcloth (not used since being washed) for each shower. DO NOT sleep with pet's night before surgery.  CHG Shower Instructions:  Wash your face and private area with normal soap. If you choose to wash your hair, wash first with your normal shampoo.  After you use shampoo/soap, rinse your hair and body thoroughly to remove shampoo/soap residue.  Turn the water OFF and apply half the bottle of CHG soap to a CLEAN washcloth.  Apply CHG soap ONLY FROM YOUR NECK DOWN TO YOUR TOES (washing for 3-5 minutes)  DO NOT use CHG soap on face, private areas, open wounds, or sores.  Pay special attention to the area where your surgery is being performed.  If you are having back surgery, having someone wash your back for you may be helpful. Wait 2 minutes after CHG soap is applied, then you may rinse off the CHG soap.  Pat dry with a clean towel  Put on clean pajamas    Additional instructions for the day of surgery: DO NOT APPLY any lotions,  powder,  oils,  deodorants (may use underarm deodorant) , cologne/  perfumes  or makeup Do not wear jewelry /  piercing's/  metal/  permanent jewelry must be removed prior to arrival day of surgery.  (No plastic piercing) Do  not wear nail polish, gel polish, artificial nails, or any other type of covering on natural finger nails (toe nails are okay) Do not bring valuables to the hospital. [facility] is not responsible for valuables/personal belongings. Put on clean/comfortable clothes.  Please brush your teeth.  Ask your nurse before applying any prescription medications to the skin.

## 2023-11-03 ENCOUNTER — Encounter (HOSPITAL_COMMUNITY)
Admission: RE | Admit: 2023-11-03 | Discharge: 2023-11-03 | Disposition: A | Discharge status: Home / Self Care | Source: Ambulatory Visit | Attending: Obstetrics and Gynecology | Admitting: Obstetrics and Gynecology

## 2023-11-03 DIAGNOSIS — Z01812 Encounter for preprocedural laboratory examination: Secondary | ICD-10-CM | POA: Diagnosis present

## 2023-11-03 LAB — CBC
HCT: 45.3 % (ref 36.0–46.0)
Hemoglobin: 15.3 g/dL — ABNORMAL HIGH (ref 12.0–15.0)
MCH: 32.7 pg (ref 26.0–34.0)
MCHC: 33.8 g/dL (ref 30.0–36.0)
MCV: 96.8 fL (ref 80.0–100.0)
Platelets: 361 10*3/uL (ref 150–400)
RBC: 4.68 MIL/uL (ref 3.87–5.11)
RDW: 12.8 % (ref 11.5–15.5)
WBC: 9.7 10*3/uL (ref 4.0–10.5)
nRBC: 0 % (ref 0.0–0.2)

## 2023-11-03 LAB — TYPE AND SCREEN
ABO/RH(D): A POS
Antibody Screen: NEGATIVE

## 2023-11-06 ENCOUNTER — Ambulatory Visit (HOSPITAL_COMMUNITY)
Admission: RE | Admit: 2023-11-06 | Discharge: 2023-11-07 | Disposition: A | Discharge status: Home / Self Care | Attending: Obstetrics and Gynecology | Admitting: Obstetrics and Gynecology

## 2023-11-06 ENCOUNTER — Encounter (HOSPITAL_COMMUNITY): Payer: Self-pay | Admitting: Obstetrics and Gynecology

## 2023-11-06 ENCOUNTER — Encounter (HOSPITAL_COMMUNITY)
Admission: RE | Disposition: A | Discharge status: Home / Self Care | Payer: Self-pay | Source: Home / Self Care | Attending: Obstetrics and Gynecology

## 2023-11-06 ENCOUNTER — Other Ambulatory Visit: Payer: Self-pay

## 2023-11-06 ENCOUNTER — Other Ambulatory Visit: Payer: Self-pay | Admitting: Obstetrics and Gynecology

## 2023-11-06 ENCOUNTER — Ambulatory Visit (HOSPITAL_COMMUNITY): Admitting: Anesthesiology

## 2023-11-06 DIAGNOSIS — K219 Gastro-esophageal reflux disease without esophagitis: Secondary | ICD-10-CM | POA: Diagnosis not present

## 2023-11-06 DIAGNOSIS — N879 Dysplasia of cervix uteri, unspecified: Secondary | ICD-10-CM | POA: Insufficient documentation

## 2023-11-06 DIAGNOSIS — N736 Female pelvic peritoneal adhesions (postinfective): Secondary | ICD-10-CM | POA: Diagnosis not present

## 2023-11-06 DIAGNOSIS — Z01818 Encounter for other preprocedural examination: Secondary | ICD-10-CM

## 2023-11-06 DIAGNOSIS — N857 Hematometra: Secondary | ICD-10-CM

## 2023-11-06 DIAGNOSIS — R102 Pelvic and perineal pain unspecified side: Secondary | ICD-10-CM

## 2023-11-06 DIAGNOSIS — F17210 Nicotine dependence, cigarettes, uncomplicated: Secondary | ICD-10-CM | POA: Diagnosis not present

## 2023-11-06 DIAGNOSIS — G8929 Other chronic pain: Secondary | ICD-10-CM | POA: Diagnosis present

## 2023-11-06 DIAGNOSIS — N72 Inflammatory disease of cervix uteri: Secondary | ICD-10-CM | POA: Insufficient documentation

## 2023-11-06 HISTORY — DX: Calculus of kidney: N20.0

## 2023-11-06 HISTORY — DX: Generalized anxiety disorder: F41.1

## 2023-11-06 HISTORY — DX: Personal history of other complications of pregnancy, childbirth and the puerperium: Z87.59

## 2023-11-06 HISTORY — DX: Hematometra: N85.7

## 2023-11-06 HISTORY — DX: Chronic tension-type headache, not intractable: G44.229

## 2023-11-06 HISTORY — DX: Panic disorder (episodic paroxysmal anxiety): F41.0

## 2023-11-06 HISTORY — DX: Gastro-esophageal reflux disease without esophagitis: K21.9

## 2023-11-06 HISTORY — PX: HYSTERECTOMY, TOTAL, LAPAROSCOPIC, ROBOT-ASSISTED WITH SALPINGECTOMY: SHX7587

## 2023-11-06 HISTORY — DX: Personal history of urinary calculi: Z87.442

## 2023-11-06 HISTORY — DX: Other chronic pain: G89.29

## 2023-11-06 HISTORY — DX: Mixed hyperlipidemia: E78.2

## 2023-11-06 LAB — ABO/RH: ABO/RH(D): A POS

## 2023-11-06 LAB — POCT PREGNANCY, URINE: Preg Test, Ur: NEGATIVE

## 2023-11-06 SURGERY — HYSTERECTOMY, TOTAL, LAPAROSCOPIC, ROBOT-ASSISTED WITH SALPINGECTOMY
Anesthesia: General | Site: Pelvis | Laterality: Bilateral

## 2023-11-06 MED ORDER — 0.9 % SODIUM CHLORIDE (POUR BTL) OPTIME
TOPICAL | Status: DC | PRN
  Administered 2023-11-06: 1000 mL

## 2023-11-06 MED ORDER — KETOROLAC TROMETHAMINE 30 MG/ML IJ SOLN
30.0000 mg | Freq: Once | INTRAMUSCULAR | Status: AC
  Administered 2023-11-06: 30 mg via INTRAVENOUS
  Filled 2023-11-06: qty 1

## 2023-11-06 MED ORDER — ENSURE PRE-SURGERY PO LIQD: 592.0000 mL | Freq: Once | ORAL | Status: DC

## 2023-11-06 MED ORDER — ENSURE PRE-SURGERY PO LIQD: 296.0000 mL | Freq: Once | ORAL | Status: DC

## 2023-11-06 MED ORDER — DEXMEDETOMIDINE HCL IN NACL 80 MCG/20ML IV SOLN
INTRAVENOUS | Status: DC | PRN
  Administered 2023-11-06: 8 ug via INTRAVENOUS
  Administered 2023-11-06 (×3): 4 ug via INTRAVENOUS

## 2023-11-06 MED ORDER — LACTATED RINGERS IV SOLN: INTRAVENOUS | Status: DC

## 2023-11-06 MED ORDER — ONDANSETRON HCL 4 MG PO TABS
4.0000 mg | ORAL_TABLET | Freq: Four times a day (QID) | ORAL | Status: DC | PRN
  Administered 2023-11-06: 4 mg via ORAL
  Filled 2023-11-06: qty 1

## 2023-11-06 MED ORDER — CELECOXIB 200 MG PO CAPS
400.0000 mg | ORAL_CAPSULE | ORAL | Status: AC
  Administered 2023-11-06: 400 mg via ORAL

## 2023-11-06 MED ORDER — MIDAZOLAM HCL 2 MG/2ML IJ SOLN
INTRAMUSCULAR | Status: DC | PRN
  Administered 2023-11-06: 2 mg via INTRAVENOUS

## 2023-11-06 MED ORDER — CHLORHEXIDINE GLUCONATE 0.12 % MT SOLN
OROMUCOSAL | Status: AC
  Filled 2023-11-06: qty 15

## 2023-11-06 MED ORDER — SODIUM CHLORIDE (PF) 0.9 % IJ SOLN
INTRAMUSCULAR | Status: AC
  Filled 2023-11-06: qty 10

## 2023-11-06 MED ORDER — HYDROCODONE-ACETAMINOPHEN 5-325 MG PO TABS: ORAL_TABLET | ORAL | 0 refills | Status: DC

## 2023-11-06 MED ORDER — GABAPENTIN 300 MG PO CAPS
300.0000 mg | ORAL_CAPSULE | ORAL | Status: AC
  Administered 2023-11-06: 300 mg via ORAL

## 2023-11-06 MED ORDER — CHLORHEXIDINE GLUCONATE 0.12 % MT SOLN
15.0000 mL | Freq: Once | OROMUCOSAL | Status: AC
  Administered 2023-11-06: 15 mL via OROMUCOSAL

## 2023-11-06 MED ORDER — ROCURONIUM BROMIDE 10 MG/ML (PF) SYRINGE
PREFILLED_SYRINGE | INTRAVENOUS | Status: AC
Start: 2023-11-06 — End: ?
  Filled 2023-11-06: qty 10

## 2023-11-06 MED ORDER — ONDANSETRON HCL 4 MG/2ML IJ SOLN: 4.0000 mg | Freq: Four times a day (QID) | INTRAMUSCULAR | Status: DC | PRN

## 2023-11-06 MED ORDER — CELECOXIB 200 MG PO CAPS
ORAL_CAPSULE | ORAL | Status: AC
  Filled 2023-11-06: qty 2

## 2023-11-06 MED ORDER — ACETAMINOPHEN 500 MG PO TABS
ORAL_TABLET | ORAL | Status: AC
  Administered 2023-11-06: 1000 mg via ORAL
  Filled 2023-11-06: qty 2

## 2023-11-06 MED ORDER — LACTATED RINGERS IV SOLN: INTRAVENOUS | Status: DC | PRN

## 2023-11-06 MED ORDER — SCOPOLAMINE 1 MG/3DAYS TD PT72
1.0000 | MEDICATED_PATCH | TRANSDERMAL | Status: DC
  Administered 2023-11-06: 1.5 mg via TRANSDERMAL

## 2023-11-06 MED ORDER — SCOPOLAMINE 1 MG/3DAYS TD PT72
MEDICATED_PATCH | TRANSDERMAL | Status: AC
  Filled 2023-11-06: qty 1

## 2023-11-06 MED ORDER — SODIUM CHLORIDE 0.9 % IR SOLN
Status: DC | PRN
  Administered 2023-11-06: 1000 mL via INTRAVESICAL

## 2023-11-06 MED ORDER — PROPOFOL 10 MG/ML IV BOLUS
INTRAVENOUS | Status: AC
  Filled 2023-11-06: qty 20

## 2023-11-06 MED ORDER — SIMETHICONE 80 MG PO CHEW
80.0000 mg | CHEWABLE_TABLET | Freq: Four times a day (QID) | ORAL | Status: DC | PRN
  Administered 2023-11-06 – 2023-11-07 (×3): 80 mg via ORAL
  Filled 2023-11-06 (×3): qty 1

## 2023-11-06 MED ORDER — IBUPROFEN 600 MG PO TABS
600.0000 mg | ORAL_TABLET | Freq: Four times a day (QID) | ORAL | Status: DC
  Administered 2023-11-06 – 2023-11-07 (×3): 600 mg via ORAL
  Filled 2023-11-06: qty 3
  Filled 2023-11-06: qty 1
  Filled 2023-11-06: qty 3
  Filled 2023-11-06 (×2): qty 1
  Filled 2023-11-06: qty 3
  Filled 2023-11-06: qty 1

## 2023-11-06 MED ORDER — DROPERIDOL 2.5 MG/ML IJ SOLN
INTRAMUSCULAR | Status: AC
  Filled 2023-11-06: qty 2

## 2023-11-06 MED ORDER — FENTANYL CITRATE (PF) 250 MCG/5ML IJ SOLN
INTRAMUSCULAR | Status: AC
  Filled 2023-11-06: qty 5

## 2023-11-06 MED ORDER — LIDOCAINE 2% (20 MG/ML) 5 ML SYRINGE
INTRAMUSCULAR | Status: DC | PRN
  Administered 2023-11-06: 60 mg via INTRAVENOUS

## 2023-11-06 MED ORDER — SODIUM CHLORIDE (PF) 0.9 % IJ SOLN
INTRAMUSCULAR | Status: AC
  Filled 2023-11-06: qty 50

## 2023-11-06 MED ORDER — HYDROMORPHONE HCL 1 MG/ML IJ SOLN
INTRAMUSCULAR | Status: AC
Start: 2023-11-06 — End: ?
  Filled 2023-11-06: qty 1

## 2023-11-06 MED ORDER — STERILE WATER FOR IRRIGATION IR SOLN
Status: DC | PRN
  Administered 2023-11-06: 1000 mL

## 2023-11-06 MED ORDER — DEXAMETHASONE SODIUM PHOSPHATE 10 MG/ML IJ SOLN
INTRAMUSCULAR | Status: DC | PRN
  Administered 2023-11-06: 10 mg via INTRAVENOUS

## 2023-11-06 MED ORDER — ORAL CARE MOUTH RINSE: 15.0000 mL | Freq: Once | OROMUCOSAL | Status: AC

## 2023-11-06 MED ORDER — MIDAZOLAM HCL 2 MG/2ML IJ SOLN
INTRAMUSCULAR | Status: AC
Start: 2023-11-06 — End: ?
  Filled 2023-11-06: qty 2

## 2023-11-06 MED ORDER — SUGAMMADEX SODIUM 200 MG/2ML IV SOLN
INTRAVENOUS | Status: DC | PRN
  Administered 2023-11-06: 200 mg via INTRAVENOUS

## 2023-11-06 MED ORDER — HYDROMORPHONE HCL 1 MG/ML IJ SOLN
INTRAMUSCULAR | Status: DC | PRN
  Administered 2023-11-06: .5 mg via INTRAVENOUS

## 2023-11-06 MED ORDER — POVIDONE-IODINE 10 % EX SWAB
2.0000 | Freq: Once | CUTANEOUS | Status: AC
  Administered 2023-11-06: 2 via TOPICAL

## 2023-11-06 MED ORDER — HYDROMORPHONE HCL 1 MG/ML IJ SOLN
0.2500 mg | INTRAMUSCULAR | Status: DC | PRN
  Administered 2023-11-06 (×2): 0.25 mg via INTRAVENOUS

## 2023-11-06 MED ORDER — FENTANYL CITRATE (PF) 250 MCG/5ML IJ SOLN
INTRAMUSCULAR | Status: DC | PRN
Start: 2023-11-06 — End: 2023-11-06
  Administered 2023-11-06: 50 ug via INTRAVENOUS
  Administered 2023-11-06 (×2): 100 ug via INTRAVENOUS

## 2023-11-06 MED ORDER — PROPOFOL 10 MG/ML IV BOLUS
INTRAVENOUS | Status: AC
Start: 2023-11-06 — End: ?
  Filled 2023-11-06: qty 20

## 2023-11-06 MED ORDER — LACTATED RINGERS IV SOLN
INTRAVENOUS | Status: DC
  Administered 2023-11-06: 1000 mL via INTRAVENOUS

## 2023-11-06 MED ORDER — ONDANSETRON HCL 4 MG/2ML IJ SOLN
INTRAMUSCULAR | Status: DC | PRN
  Administered 2023-11-06: 4 mg via INTRAVENOUS

## 2023-11-06 MED ORDER — HYDROMORPHONE HCL 2 MG PO TABS
1.0000 mg | ORAL_TABLET | Freq: Four times a day (QID) | ORAL | Status: DC | PRN
  Administered 2023-11-06 – 2023-11-07 (×3): 1 mg via ORAL
  Filled 2023-11-06 (×3): qty 1

## 2023-11-06 MED ORDER — DROPERIDOL 2.5 MG/ML IJ SOLN
0.6250 mg | Freq: Once | INTRAMUSCULAR | Status: AC | PRN
Start: 2023-11-06 — End: 2023-11-06
  Administered 2023-11-06: 0.625 mg via INTRAVENOUS

## 2023-11-06 MED ORDER — ACETAMINOPHEN 500 MG PO TABS
1000.0000 mg | ORAL_TABLET | Freq: Four times a day (QID) | ORAL | Status: DC
  Administered 2023-11-06 – 2023-11-07 (×2): 1000 mg via ORAL
  Filled 2023-11-06 (×3): qty 2

## 2023-11-06 MED ORDER — PROPOFOL 10 MG/ML IV BOLUS
INTRAVENOUS | Status: DC | PRN
  Administered 2023-11-06: 20 mg via INTRAVENOUS
  Administered 2023-11-06: 150 mg via INTRAVENOUS

## 2023-11-06 MED ORDER — CEFAZOLIN SODIUM-DEXTROSE 2-4 GM/100ML-% IV SOLN
INTRAVENOUS | Status: AC
  Filled 2023-11-06: qty 100

## 2023-11-06 MED ORDER — ROCURONIUM BROMIDE 10 MG/ML (PF) SYRINGE
PREFILLED_SYRINGE | INTRAVENOUS | Status: DC | PRN
  Administered 2023-11-06: 10 mg via INTRAVENOUS
  Administered 2023-11-06: 30 mg via INTRAVENOUS
  Administered 2023-11-06: 60 mg via INTRAVENOUS
  Administered 2023-11-06: 30 mg via INTRAVENOUS
  Administered 2023-11-06: 10 mg via INTRAVENOUS

## 2023-11-06 MED ORDER — SODIUM CHLORIDE (PF) 0.9 % IJ SOLN
INTRAMUSCULAR | Status: DC | PRN
  Administered 2023-11-06: 120 mL

## 2023-11-06 MED ORDER — MENTHOL 3 MG MT LOZG: 1.0000 | LOZENGE | OROMUCOSAL | Status: DC | PRN

## 2023-11-06 MED ORDER — GABAPENTIN 300 MG PO CAPS
ORAL_CAPSULE | ORAL | Status: AC
  Filled 2023-11-06: qty 1

## 2023-11-06 MED ORDER — MIDAZOLAM HCL 2 MG/2ML IJ SOLN
INTRAMUSCULAR | Status: AC
  Filled 2023-11-06: qty 2

## 2023-11-06 MED ORDER — DOCUSATE SODIUM 100 MG PO CAPS
100.0000 mg | ORAL_CAPSULE | Freq: Two times a day (BID) | ORAL | Status: DC
  Administered 2023-11-06 (×2): 100 mg via ORAL
  Filled 2023-11-06 (×2): qty 1

## 2023-11-06 MED ORDER — HYDROMORPHONE HCL 1 MG/ML IJ SOLN
INTRAMUSCULAR | Status: AC
  Filled 2023-11-06: qty 0.5

## 2023-11-06 MED ORDER — CEFAZOLIN SODIUM-DEXTROSE 2-4 GM/100ML-% IV SOLN
2.0000 g | INTRAVENOUS | Status: AC
  Administered 2023-11-06: 2 g via INTRAVENOUS

## 2023-11-06 MED ORDER — ROPIVACAINE HCL 5 MG/ML IJ SOLN
INTRAMUSCULAR | Status: AC
  Filled 2023-11-06: qty 60

## 2023-11-06 MED ORDER — ACETAMINOPHEN 500 MG PO TABS
1000.0000 mg | ORAL_TABLET | ORAL | Status: AC
  Administered 2023-11-06: 1000 mg via ORAL

## 2023-11-06 SURGICAL SUPPLY — 60 items
BAG COUNTER SPONGE SURGICOUNT (BAG) ×2 IMPLANT
BAG URINE DRAIN 2000ML AR STRL (UROLOGICAL SUPPLIES) ×2 IMPLANT
BARRIER ADHS 3X4 INTERCEED (GAUZE/BANDAGES/DRESSINGS) IMPLANT
CANISTER SUCTION 3000ML PPV (SUCTIONS) ×2 IMPLANT
CATH FOLEY 3WAY 5CC 16FR (CATHETERS) ×2 IMPLANT
COVER BACK TABLE 60X90IN (DRAPES) ×2 IMPLANT
COVER MAYO STAND STRL (DRAPES) ×2 IMPLANT
COVER TIP SHEARS 8 DVNC (MISCELLANEOUS) ×2 IMPLANT
DEFOGGER SCOPE WARM SEASHARP (MISCELLANEOUS) ×2 IMPLANT
DERMABOND ADVANCED .7 DNX12 (GAUZE/BANDAGES/DRESSINGS) ×2 IMPLANT
DILATOR CANAL MILEX (MISCELLANEOUS) IMPLANT
DRAPE ARM DVNC X/XI (DISPOSABLE) ×8 IMPLANT
DRAPE COLUMN DVNC XI (DISPOSABLE) ×2 IMPLANT
DRAPE SURG IRRIG POUCH 19X23 (DRAPES) ×2 IMPLANT
DRAPE UTILITY XL STRL (DRAPES) ×2 IMPLANT
DRIVER NDL MEGA SUTCUT DVNCXI (INSTRUMENTS) ×2 IMPLANT
DRIVER NDLE MEGA SUTCUT DVNCXI (INSTRUMENTS) ×1 IMPLANT
DRSG ADAPTIC 3X8 NADH LF (GAUZE/BANDAGES/DRESSINGS) IMPLANT
DURAPREP 26ML APPLICATOR (WOUND CARE) ×2 IMPLANT
ELECTRODE REM PT RTRN 9FT ADLT (ELECTROSURGICAL) ×2 IMPLANT
FORCEPS BPLR LNG DVNC XI (INSTRUMENTS) ×2 IMPLANT
FORCEPS LONG TIP 8 DVNC XI (FORCEP) ×2 IMPLANT
FORCEPS MICRO DMD BLK DVNC XI (FORCEP) IMPLANT
FORCEPS PROGRASP DVNC XI (FORCEP) IMPLANT
GAUZE 4X4 16PLY ~~LOC~~+RFID DBL (SPONGE) IMPLANT
GLOVE BIO SURGEON STRL SZ7 (GLOVE) ×2 IMPLANT
GLOVE BIOGEL PI IND STRL 7.0 (GLOVE) ×6 IMPLANT
GLOVE ECLIPSE 6.5 STRL STRAW (GLOVE) ×6 IMPLANT
HIBICLENS CHG 4% 4OZ BTL (MISCELLANEOUS) ×4 IMPLANT
IRRIGATION STRYKERFLOW (MISCELLANEOUS) ×2 IMPLANT
KIT PINK PAD W/HEAD ARE REST (MISCELLANEOUS) ×1 IMPLANT
KIT PINK PAD W/HEAD ARM REST (MISCELLANEOUS) ×2 IMPLANT
LEGGING LITHOTOMY PAIR STRL (DRAPES) ×2 IMPLANT
NDL HYPO 22X1.5 SAFETY MO (MISCELLANEOUS) ×2 IMPLANT
NDL INSUFFLATION 14GA 120MM (NEEDLE) IMPLANT
NEEDLE HYPO 22X1.5 SAFETY MO (MISCELLANEOUS) ×1 IMPLANT
NEEDLE INSUFFLATION 14GA 120MM (NEEDLE) ×1 IMPLANT
OBTURATOR OPTICALSTD 8 DVNC (TROCAR) ×2 IMPLANT
OCCLUDER COLPOPNEUMO (BALLOONS) ×2 IMPLANT
PACK ROBOT WH (CUSTOM PROCEDURE TRAY) ×2 IMPLANT
PACK ROBOTIC GOWN (GOWN DISPOSABLE) ×2 IMPLANT
PAD OB MATERNITY 11 LF (PERSONAL CARE ITEMS) ×2 IMPLANT
POUCH LAPAROSCOPIC INSTRUMENT (MISCELLANEOUS) ×2 IMPLANT
SCISSORS MNPLR CVD DVNC XI (INSTRUMENTS) ×2 IMPLANT
SEAL UNIV 5-12 XI (MISCELLANEOUS) ×8 IMPLANT
SEALER VESSEL EXT DVNC XI (MISCELLANEOUS) ×2 IMPLANT
SET IRRIG Y TYPE TUR BLADDER L (SET/KITS/TRAYS/PACK) IMPLANT
SET TRI-LUMEN FLTR TB AIRSEAL (TUBING) ×2 IMPLANT
SPIKE FLUID TRANSFER (MISCELLANEOUS) ×4 IMPLANT
STRIP CLOSURE SKIN 1/4X4 (GAUZE/BANDAGES/DRESSINGS) ×2 IMPLANT
SUT MNCRL AB 3-0 PS2 27 (SUTURE) ×4 IMPLANT
SUT VIC AB 0 CT1 27XBRD ANBCTR (SUTURE) ×4 IMPLANT
SUT VICRYL 0 UR6 27IN ABS (SUTURE) ×2 IMPLANT
SUT VLOC 180 0 9IN GS21 (SUTURE) ×2 IMPLANT
TIP UTERINE 6.7X10CM GRN DISP (MISCELLANEOUS) IMPLANT
TIP UTERINE 6.7X8CM BLUE DISP (MISCELLANEOUS) IMPLANT
TOWEL GREEN STERILE (TOWEL DISPOSABLE) ×2 IMPLANT
TROCAR PORT AIRSEAL 8X120 (TROCAR) ×2 IMPLANT
UNDERPAD 30X36 HEAVY ABSORB (UNDERPADS AND DIAPERS) ×2 IMPLANT
WATER STERILE IRR 1000ML POUR (IV SOLUTION) ×2 IMPLANT

## 2023-11-06 NOTE — Op Note (Signed)
 Preoperative diagnosis: pelvic pain with hematometra  Postoperative diagnosis: Same with severe pelvic adhesions  Anesthesia: General  Anesthesiologist: Dr. Lavada Porteous  Procedure: Robotically assisted total hysterectomy with cystoscopy  Surgeon: Dr. Adell Hones Dellie Piasecki  Assistant: Ivin Marrow P.A.-C.  Estimated blood loss: 25 cc  Procedure:  After being informed of the planned procedure with possible complications including but not limited to bleeding, infection, injury to other organs, need for laparotomy, expected hospital stay and recovery, informed consent is obtained and patient is taken to or #5. She is placed in  lithotomy position on Pink Pad with both arms padded and tucked on each side and bilateral knee-high sequential compressive devices. She is given general anesthesia with endotracheal intubation without any complication. She is prepped and draped in a sterile fashion. A three-way Foley catheter is inserted in her bladder.  Pelvic exam reveals: anteverted normal size uterus with normal adnexa  A weighted speculum is inserted in the vagina and the anterior lip of the cervix is grasped with a tenaculum forcep. We proceed with a paracervical block and vaginal infiltration using ropivacaine 0.5% diluted 1 in 1 with saline. The uterus was then sounded at 8 cm. We easily dilate the cervix using Hegar dilator to  #27 which allows for easy placement of the intrauterine RUMI manipulator with a 3.0 KOH ring and a vaginal occluder. The ring is sutured to the cervix with 0 Vicryl.  Trocar placement is decided after measuring the probable location of the 12x12 cm mesh from previous abdominal hernia repair.. We infiltrate the umbilicus with 10 cc of ropivacaine per protocol and perform a 10 mm semi-elliptical incision which is brought down bluntly to the fascia. The fascia is identified and grasped with Coker forceps. The fascia is incised with Mayo scissors. Peritoneum is entered bluntly after we  insufflate with CO2 using a Verres needle in the left upper quadrant. A pursestring suture of 0 Vicryl is placed on the fascia and a 10 mm Hassan trocar is easily inserted in the abdominal cavity held in placed with a Purstring suture. This allows for easy insufflation of a pneumoperitoneum using warmed CO2 at a maximum pressure of 15 mm of mercury. 60 cc of Ropivacaine 0.5 % diluted 1 in 1 is sent in the pelvis and the patient is positioned in reverse Trendelenburg. We then placed two 8mm robotic trocar on the left, one 8mm robotic trocar on the right and one one 8 mm patient's side assistant trocar on the right  after infiltrating every site  with ropivacaine per protocol. The robot is docked on the left of the patient after positioning her in Trendelenburg. A Vessel Seal is inserted in arm #4, a Long bipolar forceps is inserted in arm #2 and a Prograsp is inserted in arm #1.  Preparation and docking is completed in 73 minutes.  Observation: We note severe adhesions between omentum and abdominal wall above the umbilicus at the location of the abdominal mesh. This obligates us  to move our camera for every trocar insertion to maintain good visualization with each trocar entry. The umbilical site is inspected with no injury and/or bleeding. The uterus is normal size. The RUMI manipulator has perforated the uterus on the left side with no active bleeding (patient is post endometrial ablation). Both ovaries are normal. We confirm complete absence of both tubes from sterilization during the last cesarean section.   We start on the right side by sealing and cutting the mesosalpynx , the right utero-ovarian ligament and the right round  ligament .  This gives us  entry into the retroperitoneal space with an easy dissection of the anterior broad ligament. The ligament was opened all the way to the left round ligament.   Moving to the left side we Seal and cut  the left round ligament , the left utero-ovarian ligament  and  the mesosalpinx in between. Entry into the retroperitoneal space allows us  to complete dissection of the vaginal cuff which is easily identified with the KOH ring. We are able to complete the left side by moving around the perforated area.    We then proceed with systematic dissection of the bladder from the anterior which requires to fill the bladder with 200 cc of saline in order to confirm the plane of dissection  We are able to dissect the bladder 2 cm below the KOH ring. We then opened the posterior right broad ligament all the way to the posterior KOH ring after identifying the full course of the right ureter.   We skeletonized the uterine vessels. The left broad ligament is then dissected all the way to the posterior KOH ring after identifying the full course of the left ureter.   With pressure on the KOH ring and the bladder fully dissected, we cauterize and cut the uterine vessels on both sides at the level of the KOH ring.  The vaginal occluder is inflated and we proceed with a 360 colpotomy using an open monopolar scissors and freeing the uterus entirely.  The uterus is delivered vaginally with simple traction. The vaginal occluder is reinserted in the vagina to maintain pneumoperitoneum.  Instruments are then modified for a suture cut in arm #4 and a long tip forcep in arm #2. We proceed with closure of the vaginal cuff with 2 running sutures of 0 V-Lock. We irrigated profusely with warm saline and confirm a satisfactory hemostasis as well as 2 normal ureters with good mobility and no dilatation.  A sheet of Interceed , divided in 2, is placed on the vaginal cuff and behind the ovaries.  All instruments are then removed and the robot is undocked. Console time: 1 hour and 25 minutes.  All trochars are removed under direct visualization after evacuating the pneumoperitoneum.  The fascia of the supraumbilical incision is closed with the previously placed pursestring suture of 0  Vicryl. All incisions are then closed with subcuticular suture of 3-0 Monocryl and Dermabond.  CYSTOSCOPY: With the significant bladder dissection, we decide to proceed with cystoscopy. We confirm an intact bladder as well as two normal ureteral openings and jets. We leave 300 cc of saline in the bladder at the end of the cystoscopy.  A speculum is inserted in the vagina to confirm a adequate closure of the vaginal cuff and good hemostasis.  Instrument and sponge count is complete x2. The procedure is well tolerated by the patient is taken to recovery room in a well and stable condition.  Dr Duke Gibbons was present and scrubbed at all times. Surgical assistance was required due to the complexity of the anatomy and the robotic approach of the procedure.   Estimated blood loss: 25 cc  Specimen: Uterus weighing 78 g sent to pathology

## 2023-11-06 NOTE — Plan of Care (Signed)
  Problem: Education: Goal: Knowledge of General Education information will improve Description: Including pain rating scale, medication(s)/side effects and non-pharmacologic comfort measures Outcome: Completed/Met   Problem: Education: Goal: Knowledge of General Education information will improve Description: Including pain rating scale, medication(s)/side effects and non-pharmacologic comfort measures Outcome: Completed/Met   Problem: Health Behavior/Discharge Planning: Goal: Ability to manage health-related needs will improve Outcome: Completed/Met   Problem: Clinical Measurements: Goal: Ability to maintain clinical measurements within normal limits will improve Outcome: Completed/Met Goal: Will remain free from infection Outcome: Completed/Met Goal: Diagnostic test results will improve Outcome: Completed/Met Goal: Respiratory complications will improve Outcome: Completed/Met Goal: Cardiovascular complication will be avoided Outcome: Completed/Met   Problem: Education: Goal: Knowledge of the prescribed therapeutic regimen will improve Outcome: Completed/Met Goal: Understanding of sexual limitations or changes related to disease process or condition will improve Outcome: Completed/Met Goal: Individualized Educational Video(s) Outcome: Completed/Met   Problem: Skin Integrity: Goal: Risk for impaired skin integrity will decrease Outcome: Completed/Met   Problem: Safety: Goal: Ability to remain free from injury will improve Outcome: Completed/Met   Problem: Skin Integrity: Goal: Demonstration of wound healing without infection will improve Outcome: Completed/Met   Problem: Self-Concept: Goal: Communication of feelings regarding changes in body function or appearance will improve Outcome: Completed/Met

## 2023-11-06 NOTE — Discharge Instructions (Signed)
 Call [facility] at [phone] for :  Post operative appointment time and date if it is not included in your post operative information.  Temperature greater than or equal to 100.4 degrees Farenheit orally Excessive pain not managed with your pain medications Excessive bleeding, problems urinating or other concerns   While taking pain medications, take Colace (Docusate Sodium ) 100 mg 2-3 times daily until bowel movements are regular to prevent constipation. Take Ibuprofen  600 mg with food and Acetaminophen  500 mg  (#2 tablets) every 6 hours for 5 days then prn  You may shower tomorrow You may walk up stairs  You may drive after 2 weeks You may resume a regular diet  You should not lift over 15 pounds for 6 weeks You should not place anything in your vagina for 6 weeks

## 2023-11-06 NOTE — Anesthesia Procedure Notes (Signed)
 Procedure Name: Intubation Date/Time: 11/06/2023 7:42 AM  Performed by: Ballard Levels, CRNAPre-anesthesia Checklist: Patient identified, Emergency Drugs available, Suction available and Patient being monitored Patient Re-evaluated:Patient Re-evaluated prior to induction Oxygen Delivery Method: Circle System Utilized Preoxygenation: Pre-oxygenation with 100% oxygen Induction Type: IV induction Ventilation: Mask ventilation without difficulty and Oral airway inserted - appropriate to patient size Laryngoscope Size: Mac and 3 Grade View: Grade I Tube type: Oral Number of attempts: 1 Airway Equipment and Method: Stylet and Oral airway Placement Confirmation: ETT inserted through vocal cords under direct vision, positive ETCO2 and breath sounds checked- equal and bilateral Secured at: 22 cm Tube secured with: Tape Dental Injury: Teeth and Oropharynx as per pre-operative assessment

## 2023-11-06 NOTE — Interval H&P Note (Signed)
 History and Physical Interval Note:  11/06/2023 7:15 AM  Sidonie Drape  has presented today for surgery, with the diagnosis of CHRONIC PELVIC PAIN, HEMATOMETRIA.  The various methods of treatment have been discussed with the patient and family. After consideration of risks, benefits and other options for treatment, the patient has consented to  Procedure(s): HYSTERECTOMY, TOTAL, LAPAROSCOPIC, ROBOT-ASSISTED WITH SALPINGECTOMY (Bilateral) as a surgical intervention.  The patient's history has been reviewed, patient examined, no change in status, stable for surgery.  I have reviewed the patient's chart and labs.  Questions were answered to the patient's satisfaction.     Adell Hones A Treshon Stannard

## 2023-11-06 NOTE — Progress Notes (Signed)
 Patient of Dr. Rollen Clines. Received call from patient POD#0 reporting error with hydrocodone rx at pharmacy. Unable to view prescribed outpatient postop medications in EPIC.  Patient reported no issues with ibuprofen  and tylenol  rx. 4 day taper of hydrocodone sent to pharmacy.

## 2023-11-06 NOTE — Transfer of Care (Signed)
 Immediate Anesthesia Transfer of Care Note  Patient: Terri Lopez  Procedure(s) Performed: HYSTERECTOMY, TOTAL, LAPAROSCOPIC, ROBOT-ASSISTED (Bilateral: Pelvis)  Patient Location: PACU  Anesthesia Type:General  Level of Consciousness: drowsy  Airway & Oxygen Therapy: Patient Spontanous Breathing and Patient connected to face mask oxygen  Post-op Assessment: Report given to RN and Post -op Vital signs reviewed and stable  Post vital signs: Reviewed and stable  Last Vitals:  Vitals Value Taken Time  BP 110/76 11/06/23 1115  Temp SEE PACU VS   Pulse 87 11/06/23 1116  Resp 18 11/06/23 1116  SpO2 90 % 11/06/23 1116  Vitals shown include unfiled device data.  Last Pain:  Vitals:   11/06/23 0616  TempSrc: Oral  PainSc: 2       Patients Stated Pain Goal: 6 (11/06/23 5621)  Complications: No notable events documented.

## 2023-11-06 NOTE — Anesthesia Preprocedure Evaluation (Signed)
 Anesthesia Evaluation  Patient identified by MRN, date of birth, ID band Patient awake    Reviewed: Allergy & Precautions, NPO status , Patient's Chart, lab work & pertinent test results  Airway Mallampati: II  TM Distance: >3 FB Neck ROM: Full    Dental no notable dental hx.    Pulmonary neg pulmonary ROS, Current Smoker and Patient abstained from smoking.   Pulmonary exam normal        Cardiovascular negative cardio ROS  Rhythm:Regular Rate:Normal     Neuro/Psych  Headaches  Anxiety        GI/Hepatic Neg liver ROS,GERD  ,,  Endo/Other  Hypothyroidism    Renal/GU   negative genitourinary   Musculoskeletal negative musculoskeletal ROS (+)    Abdominal Normal abdominal exam  (+)   Peds  Hematology Lab Results      Component                Value               Date                      WBC                      9.7                 11/03/2023                HGB                      15.3 (H)            11/03/2023                HCT                      45.3                11/03/2023                MCV                      96.8                11/03/2023                PLT                      361                 11/03/2023             Lab Results      Component                Value               Date                      NA                       140                 04/15/2023                K  4.8                 04/15/2023                CO2                      19 (L)              04/15/2023                GLUCOSE                  90                  04/15/2023                BUN                      12                  04/15/2023                CREATININE               0.77                04/15/2023                CALCIUM                   9.7                 04/15/2023                EGFR                     96                  04/15/2023                GFRNONAA                 >60                  03/10/2022              Anesthesia Other Findings   Reproductive/Obstetrics                             Anesthesia Physical Anesthesia Plan  ASA: 2  Anesthesia Plan: General   Post-op Pain Management: Celebrex PO (pre-op)*, Tylenol  PO (pre-op)* and Gabapentin PO (pre-op)*   Induction: Intravenous  PONV Risk Score and Plan: 2 and Ondansetron , Dexamethasone, Midazolam and Treatment may vary due to age or medical condition  Airway Management Planned: Mask and Oral ETT  Additional Equipment: None  Intra-op Plan:   Post-operative Plan: Extubation in OR  Informed Consent: I have reviewed the patients History and Physical, chart, labs and discussed the procedure including the risks, benefits and alternatives for the proposed anesthesia with the patient or authorized representative who has indicated his/her understanding and acceptance.     Dental advisory given  Plan Discussed with: CRNA  Anesthesia Plan Comments:        Anesthesia Quick Evaluation

## 2023-11-06 NOTE — TOC CM/SW Note (Signed)
 Transition of Care [facility]) - Inpatient Brief Assessment   Patient Details  Name: Terri Lopez MRN: [id number] Date of Birth: September 06, 1975  [facility] CM/SW Contact:    Juliane Och, LCSW Phone Number: 11/06/2023, 2:57 PM   Clinical Narrative:  2:57 PM Per chart review, patient resides at home with spouse. Patient has a PCP and insurance. Patient does not have SNF/HH/DME history. No TOC needs were identified at this time. TOC will continue to follow and be available to assist.  Transition of Care Asessment: Insurance and Status: Insurance coverage has been reviewed Patient has primary care physician: Yes Home environment has been reviewed: Private Residence Prior level of function:: N/A Prior/Current Home Services: No current home services Social Drivers of Health Review: SDOH reviewed no interventions necessary Readmission risk has been reviewed: Yes Transition of care needs: no transition of care needs at this time

## 2023-11-07 ENCOUNTER — Encounter (HOSPITAL_COMMUNITY): Payer: Self-pay | Admitting: Obstetrics and Gynecology

## 2023-11-07 DIAGNOSIS — G8929 Other chronic pain: Secondary | ICD-10-CM | POA: Diagnosis not present

## 2023-11-07 NOTE — Progress Notes (Signed)
 Patient and husband in room awaiting discharge. Nurse got in report that patient is going home when pain medication is put in to home pharmacy. Could not d/c pending reconciliation. Notified providerr Rivard MD. Stated that patient was not going home till the morning. Nurse notified pt and husband. Both became upset. Notified provider. Provider stated she came into room at 6pm and pt requested to stay one more night. After patient spoke to nurse and provider. PT agreed to stay the night, husband eventually calmed down. Call light within reach.

## 2023-11-07 NOTE — Anesthesia Postprocedure Evaluation (Signed)
 Anesthesia Post Note  Patient: Terri Lopez  Procedure(s) Performed: HYSTERECTOMY, TOTAL, LAPAROSCOPIC, ROBOT-ASSISTED (Bilateral: Pelvis)     Patient location during evaluation: PACU Anesthesia Type: General Level of consciousness: awake and alert Pain management: pain level controlled Vital Signs Assessment: post-procedure vital signs reviewed and stable Respiratory status: spontaneous breathing, nonlabored ventilation, respiratory function stable and patient connected to nasal cannula oxygen Cardiovascular status: blood pressure returned to baseline and stable Postop Assessment: no apparent nausea or vomiting Anesthetic complications: no   No notable events documented.  Last Vitals:  Vitals:   11/07/23 0334 11/07/23 0732  BP: 118/88 124/76  Pulse: 100 (!) 101  Resp: 16 18  Temp: 37.3 C 36.9 C  SpO2: 95% 95%    Last Pain:  Vitals:   11/07/23 0733  TempSrc:   PainSc: 7                  Jenina Moening P Jalene Lacko

## 2023-11-07 NOTE — Progress Notes (Signed)
 Terri Lopez is a33 y.o.  [id number]  Post Op Date # 1: Robot Assisted Total Laparoscopic Hysterectomy/Cystoscopy  Subjective: Patient is Doing well postoperatively. Patient has Pain is controlled with current analgesics. Medications being used: acetaminophen  and prescription NSAID's including ibuprofen  (Motrin ). Ambulating without dizziness, voiding, passing flatus and tolerating a regular diet. Does admit to gas pain in right neck area (has heating pad on the area)-goes & comes.  Objective: Vital signs in last 24 hours: Temp:  [97.8 F (36.6 C)-99.1 F (37.3 C)] 99.1 F (37.3 C) (06/06 0334) Pulse Rate:  [92-108] 100 (06/06 0334) Resp:  [14-18] 16 (06/06 0334) BP: (104-149)/(75-95) 118/88 (06/06 0334) SpO2:  [92 %-97 %] 95 % (06/06 0334)  Intake/Output from previous day: 06/05 0701 - 06/06 0700 In: 2679 [P.O.:360; I.V.:2219] Out: 575 [Urine:550] Intake/Output this shift: No intake/output data recorded. Recent Labs  Lab 11/03/23 1130  WBC 9.7  HGB 15.3*  HCT 45.3  PLT 361    No results for input(s): "NA", "K", "CL", "CO2", "BUN", "CREATININE", "CALCIUM ", "PROT", "BILITOT", "ALKPHOS", "ALT", "AST", "GLUCOSE" in the last 168 hours.  Invalid input(s): "LABALBU"  EXAM: General: alert, cooperative, and no distress Resp: clear to auscultation bilaterally Cardio: regular rate and rhythm, S1, S2 normal, no murmur, click, rub or gallop GI: bowel sounds present, soft, abdominal incisions are intact without evidence of infection Extremities: Homans sign is negative, no sign of DVT No calf tenderness.   Assessment: s/p Procedure(s): HYSTERECTOMY, TOTAL, LAPAROSCOPIC, ROBOT-ASSISTED: stable, progressing well, and tolerating diet  Plan: Discharge home  LOS: 0 days    Ivin Marrow, PA-C 11/07/2023 7:28 AM

## 2023-11-10 LAB — SURGICAL PATHOLOGY

## 2023-11-21 DIAGNOSIS — F411 Generalized anxiety disorder: Secondary | ICD-10-CM | POA: Diagnosis not present

## 2023-11-25 ENCOUNTER — Ambulatory Visit: Admitting: Family Medicine

## 2023-12-02 ENCOUNTER — Encounter: Payer: Self-pay | Admitting: Family Medicine

## 2023-12-02 ENCOUNTER — Ambulatory Visit (INDEPENDENT_AMBULATORY_CARE_PROVIDER_SITE_OTHER): Admitting: Family Medicine

## 2023-12-02 VITALS — BP 129/92 | HR 112 | Ht 63.0 in | Wt 191.4 lb

## 2023-12-02 DIAGNOSIS — N809 Endometriosis, unspecified: Secondary | ICD-10-CM

## 2023-12-02 DIAGNOSIS — E7849 Other hyperlipidemia: Secondary | ICD-10-CM | POA: Diagnosis not present

## 2023-12-02 DIAGNOSIS — L989 Disorder of the skin and subcutaneous tissue, unspecified: Secondary | ICD-10-CM

## 2023-12-02 DIAGNOSIS — J019 Acute sinusitis, unspecified: Secondary | ICD-10-CM | POA: Insufficient documentation

## 2023-12-02 MED ORDER — AMOXICILLIN-POT CLAVULANATE 875-125 MG PO TABS: 1.0000 | ORAL_TABLET | Freq: Two times a day (BID) | ORAL | 0 refills | Status: AC

## 2023-12-02 MED ORDER — ROSUVASTATIN CALCIUM 5 MG PO TABS: 5.0000 mg | ORAL_TABLET | Freq: Every day | ORAL | 3 refills | Status: AC

## 2023-12-02 NOTE — Assessment & Plan Note (Signed)
 S/p hysterectomy.  Has pruritus over the incision site but otherwise doing well.  Advised to continue current regimen of topicals and antihistamines.  Pt declined gabapentin Terri Lopez.

## 2023-12-02 NOTE — Assessment & Plan Note (Signed)
 3-4 weeks of productive cough with green sputum and nasal discharge following hysterectomy. Likely bacterial given duration and purulent nature. - Augmentin 5 days - Continue current antihistamine regimen - Flonase causes nasal dryness so avoiding

## 2023-12-02 NOTE — Progress Notes (Signed)
 Established Patient Office Visit  Subjective   Patient ID: Terri Lopez, female    DOB: 07-19-1975  Age: 48 y.o. MRN: [id number]  Chief Complaint  Patient presents with   Medical Management of Chronic Issues    HPI  Subjective - Persistent cough with green sputum for 3-4 weeks since hysterectomy on 11/06/2023 - Green nasal discharge, sinus pressure, congestion - Suspects sinus infection rather than allergies as previously suggested  - Post-surgical incision site itching described as poison ivy-like rash, red and itchy - Itching affects external incisions and feels internal due to internal sutures  - Skin lesion present for years, was flat then became raised bump, requests biopsy evaluation - Reports feeling good overall aside from cough and itching  Medications: Benadryl  cream topically, hydrocortisone cream topically, Zyrtec daily, Benadryl  at night for itching, Claritin in morning, Advil  and Tylenol  as needed, muscle relaxer and ketorolac  rarely used, Synthroid   PMHx: hysterectomy 11/06/2023, high cholesterol, hypothyroidism. PSH: recent hysterectomy. FH: not discussed. Social Hx: planning [place]  vacation, lives with children, mother-in-law near [place]  ROS: denies antibiotic use post-surgery, topical treatments provide temporary relief for 1 hour then itching returns, all antihistamines cause drowsiness 3-4 hours after taking, denies muscle or joint pain currently, reports feeling less stuffed into skin recently   The 10-year ASCVD risk score (Arnett DK, et al., 2019) is: 6.6%  Health Maintenance Due  Topic Date Due   COVID-19 Vaccine (1) Never done   Hepatitis C Screening  Never done   Pneumococcal Vaccine 51-73 Years old (1 of 2 - PCV) Never done   Hepatitis B Vaccines (1 of 3 - 19+ 3-dose series) Never done   Cervical Cancer Screening (HPV/Pap Cotest)  11/05/2009   Colonoscopy  Never done      Objective:     BP (!) 129/92   Pulse (!) 112   Ht 5' 3 (1.6 m)    Wt 191 lb 6.4 oz (86.8 kg)   LMP  (LMP Unknown) Comment: due to irregular menses and hx endometrial ablation. Last menstrual cycle more than 2 yrs ago  SpO2 98%   BMI 33.90 kg/m    Physical Exam Gen: alert oriented Cv: rrr Pulm: lctab Skin: small hyperpigmented 2-77m nodular lesion on lateral right lower leg.  Laparoscopic incision sites do not appear infected.    No results found for any visits on 12/02/23.      Assessment & Plan:   Other hyperlipidemia Assessment & Plan: persistently elevated cholesterol and apoB, likely genetic component. - Start Crestor daily, avoid taking within 30 minutes of Synthroid  - Counseled on muscle aches as potential side effect, to notify if occurs - Recheck cholesterol in 6-8 weeks if tolerating medication   Endometriosis Assessment & Plan: S/p hysterectomy.  Has pruritus over the incision site but otherwise doing well.  Advised to continue current regimen of topicals and antihistamines.  Pt declined gabapentin reyna.     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  Holding off on biopsy today as pt is going on vacation later this week in which her lower extremity may be soaking in a pool or the ocean.    Subacute sinusitis, unspecified location Assessment & Plan: 3-4 weeks of productive cough with green sputum and nasal discharge following hysterectomy. Likely bacterial given duration and purulent nature. - Augmentin 5 days - Continue current antihistamine regimen - Flonase causes nasal dryness so avoiding   Other orders -     Amoxicillin-Pot Clavulanate; Take 1 tablet by mouth 2 (  two) times daily for 5 days.  Dispense: 10 tablet; Refill: 0 -     Rosuvastatin Calcium ; Take 1 tablet (5 mg total) by mouth daily.  Dispense: 90 tablet; Refill: 3     Return in about 2 months (around 02/02/2024) for hld.    Toribio MARLA Slain, MD

## 2023-12-02 NOTE — Patient Instructions (Signed)
 It was nice to see you today,  We addressed the following topics today: -I am sending in an order for Crestor.  Take this daily.  If you develop any side effects with this let us  know.  There is some information regarding this medication in this handout. - I am sending in Augmentin for you to take for the next 5 days for your sinus congestion. - Since you are going to be in a pool later this week I will hold off on doing the biopsy.  We can do a biopsy next time I see you in about 2 months. - For your itching continue use of the current topical and oral treatments you are currently taking.  If you decide that you want to use gabapentin  or Lyrica let me know and I can send that in.  Have a great day,  Rolan Slain, MD

## 2023-12-02 NOTE — Assessment & Plan Note (Signed)
 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  Holding off on biopsy today as pt is going on vacation later this week in which her lower extremity may be soaking in a pool or the ocean.

## 2023-12-02 NOTE — Assessment & Plan Note (Signed)
 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 - Start Crestor daily, avoid taking within 30 minutes of Synthroid  - Counseled on muscle aches as potential side effect, to notify if occurs - Recheck cholesterol in 6-8 weeks if tolerating medication

## 2023-12-10 DIAGNOSIS — F411 Generalized anxiety disorder: Secondary | ICD-10-CM | POA: Diagnosis not present

## 2024-02-04 ENCOUNTER — Other Ambulatory Visit

## 2024-02-11 ENCOUNTER — Ambulatory Visit: Admitting: Family Medicine

## 2024-03-22 ENCOUNTER — Other Ambulatory Visit: Payer: Self-pay | Admitting: *Deleted

## 2024-03-22 DIAGNOSIS — E039 Hypothyroidism, unspecified: Secondary | ICD-10-CM

## 2024-03-22 DIAGNOSIS — E7849 Other hyperlipidemia: Secondary | ICD-10-CM

## 2024-03-22 DIAGNOSIS — E559 Vitamin D deficiency, unspecified: Secondary | ICD-10-CM

## 2024-03-25 ENCOUNTER — Other Ambulatory Visit

## 2024-03-29 ENCOUNTER — Other Ambulatory Visit

## 2024-03-29 ENCOUNTER — Ambulatory Visit: Admitting: Family Medicine

## 2024-03-29 DIAGNOSIS — E7849 Other hyperlipidemia: Secondary | ICD-10-CM

## 2024-03-29 DIAGNOSIS — E039 Hypothyroidism, unspecified: Secondary | ICD-10-CM

## 2024-03-30 ENCOUNTER — Other Ambulatory Visit: Payer: Self-pay

## 2024-04-01 ENCOUNTER — Encounter: Admitting: Family Medicine

## 2024-05-05 ENCOUNTER — Other Ambulatory Visit: Payer: Self-pay

## 2024-05-06 ENCOUNTER — Ambulatory Visit: Payer: Self-pay | Admitting: Family Medicine

## 2024-05-06 LAB — COMPREHENSIVE METABOLIC PANEL WITH GFR
ALT: 16 IU/L (ref 0–32)
AST: 12 IU/L (ref 0–40)
Albumin: 4.2 g/dL (ref 3.9–4.9)
Alkaline Phosphatase: 91 IU/L (ref 41–116)
BUN/Creatinine Ratio: 15 (ref 9–23)
BUN: 10 mg/dL (ref 6–24)
Bilirubin Total: 0.3 mg/dL (ref 0.0–1.2)
CO2: 18 mmol/L — ABNORMAL LOW (ref 20–29)
Calcium: 9.3 mg/dL (ref 8.7–10.2)
Chloride: 103 mmol/L (ref 96–106)
Creatinine, Ser: 0.65 mg/dL (ref 0.57–1.00)
Globulin, Total: 2.7 g/dL (ref 1.5–4.5)
Glucose: 80 mg/dL (ref 70–99)
Potassium: 4.4 mmol/L (ref 3.5–5.2)
Sodium: 138 mmol/L (ref 134–144)
Total Protein: 6.9 g/dL (ref 6.0–8.5)
eGFR: 109 mL/min/1.73 (ref >59)

## 2024-05-06 LAB — CBC WITH DIFFERENTIAL/PLATELET
Basophils Absolute: 0.1 x10E3/uL (ref 0.0–0.2)
Basos: 1 %
EOS (ABSOLUTE): 0.3 x10E3/uL (ref 0.0–0.4)
Eos: 3 %
Hematocrit: 42.4 % (ref 34.0–46.6)
Hemoglobin: 14.4 g/dL (ref 11.1–15.9)
Immature Grans (Abs): 0 x10E3/uL (ref 0.0–0.1)
Immature Granulocytes: 0 %
Lymphocytes Absolute: 2.9 x10E3/uL (ref 0.7–3.1)
Lymphs: 31 %
MCH: 33.2 pg — ABNORMAL HIGH (ref 26.6–33.0)
MCHC: 34 g/dL (ref 31.5–35.7)
MCV: 98 fL — ABNORMAL HIGH (ref 79–97)
Monocytes Absolute: 0.6 x10E3/uL (ref 0.1–0.9)
Monocytes: 7 %
Neutrophils Absolute: 5.5 x10E3/uL (ref 1.4–7.0)
Neutrophils: 58 %
Platelets: 399 x10E3/uL (ref 150–450)
RBC: 4.34 x10E6/uL (ref 3.77–5.28)
RDW: 12.3 % (ref 11.7–15.4)
WBC: 9.4 x10E3/uL (ref 3.4–10.8)

## 2024-05-06 LAB — LIPID PANEL
Chol/HDL Ratio: 6.8 ratio — ABNORMAL HIGH (ref 0.0–4.4)
Cholesterol, Total: 270 mg/dL — ABNORMAL HIGH (ref 100–199)
HDL: 40 mg/dL (ref >39)
LDL Chol Calc (NIH): 189 mg/dL — ABNORMAL HIGH (ref 0–99)
Triglycerides: 212 mg/dL — ABNORMAL HIGH (ref 0–149)
VLDL Cholesterol Cal: 41 mg/dL — ABNORMAL HIGH (ref 5–40)

## 2024-05-06 LAB — HEMOGLOBIN A1C
Est. average glucose Bld gHb Est-mCnc: 108 mg/dL
Hgb A1c MFr Bld: 5.4 % (ref 4.8–5.6)

## 2024-05-06 LAB — TSH: TSH: 7.58 u[IU]/mL — ABNORMAL HIGH (ref 0.450–4.500)

## 2024-05-11 ENCOUNTER — Encounter: Admitting: Family Medicine

## 2024-05-12 ENCOUNTER — Encounter: Payer: Self-pay | Admitting: Family Medicine

## 2024-05-12 ENCOUNTER — Ambulatory Visit: Payer: PRIVATE HEALTH INSURANCE | Admitting: Family Medicine

## 2024-05-12 VITALS — BP 126/86 | HR 90 | Ht 63.0 in | Wt 194.1 lb

## 2024-05-12 DIAGNOSIS — Z6834 Body mass index (BMI) 34.0-34.9, adult: Secondary | ICD-10-CM

## 2024-05-12 DIAGNOSIS — E66811 Obesity, class 1: Secondary | ICD-10-CM

## 2024-05-12 DIAGNOSIS — E6609 Other obesity due to excess calories: Secondary | ICD-10-CM | POA: Diagnosis not present

## 2024-05-12 DIAGNOSIS — E7849 Other hyperlipidemia: Secondary | ICD-10-CM

## 2024-05-12 DIAGNOSIS — Z0000 Encounter for general adult medical examination without abnormal findings: Secondary | ICD-10-CM | POA: Diagnosis not present

## 2024-05-12 DIAGNOSIS — E039 Hypothyroidism, unspecified: Secondary | ICD-10-CM

## 2024-05-12 DIAGNOSIS — K5900 Constipation, unspecified: Secondary | ICD-10-CM | POA: Diagnosis not present

## 2024-05-12 MED ORDER — ALPRAZOLAM 1 MG PO TABS: 1.0000 mg | ORAL_TABLET | Freq: Three times a day (TID) | ORAL | 0 refills | Status: DC | PRN

## 2024-05-12 MED ORDER — LEVOTHYROXINE SODIUM 175 MCG PO TABS: 175.0000 ug | ORAL_TABLET | Freq: Every day | ORAL | 3 refills | Status: AC

## 2024-05-12 NOTE — Progress Notes (Signed)
 Annual physical  Subjective    Patient ID: Terri Lopez, female    DOB: July 12, 1975  Age: 48 y.o. MRN: [id number]  No chief complaint on file.  HPI Terri Lopez is a 48 y.o. old female here  for annual exam.    Subjective - Reports sneezing, cough, and ear pain since returning from a trip to [place]. Notes an on-and-off cough for the past few months, associated with myalgia. Reports nasal passage dryness and soreness. Symptoms have been present over the past weekend. No fevers reported.  - Reports persistent abdominal issues and bloating since a hysterectomy in June 2025, which had a prolonged healing course. States she is not constipated and has regular bowel movements, but feels stuck and has difficulty passing gas, requiring weekly use of Linzess for relief.  - Expresses concern regarding difficulty with weight loss despite a healthy diet. Reports a lack of appetite and often has to force herself to eat. Inquires about weight loss medications.  - Reports inconsistent adherence to levothyroxine  in the past but has recently been taking it regularly.  Medications Levothyroxine  175 mcg, reports recent improved adherence. Linzess as needed for abdominal symptoms. Zantac as needed, requires refill. Crestor  was prescribed previously but not started. Reports adverse effects from OTC antihistamines (Zyrtec, Claritin). Uses Flonase and saline spray as needed for nasal symptoms. Previous trial of progesterone caused night sweats and hot flashes.  PMH, PSH, FH, Social Hx PMHx: Hypothyroidism (TSH was 7 recently), hyperlipidemia, varicose vein, history of anxiety. PSH: Hysterectomy (ovaries conserved) in June 2025. FH: Mother has issues with statin medications. Mother-in-law has type 2 diabetes and is on Ozempic. Social Hx: Does not eat fast food; cooks fresh meals. Reports significant life stressors, including caring for her mother who had multiple hospitalizations over the summer. Walks for  exercise but does not lift weights. Denies significant alcohol use or smoking.  ROS HEENT: Positive for otalgia, cough, sneezing, nasal dryness. Negative for fever. GI: Positive for bloating, abdominal pain. Denies constipation but reports feeling blocked. Constitutional: Positive for myalgia, difficulty with weight loss despite poor appetite.     The 10-year ASCVD risk score (Arnett DK, et al., 2019) is: 7.3%  Health Maintenance Due  Topic Date Due   Hepatitis C Screening  Never done   Pneumococcal Vaccine (1 of 2 - PCV) Never done   Hepatitis B Vaccines 19-59 Average Risk (1 of 3 - 19+ 3-dose series) Never done   Mammogram  07/26/2016   Colonoscopy  Never done   COVID-19 Vaccine (1 - 2025-26 season) Never done      Objective:     BP 126/86   Pulse 90   Ht 5' 3 (1.6 m)   Wt 194 lb 1.9 oz (88.1 kg)   LMP  (LMP Unknown) Comment: due to irregular menses and hx endometrial ablation. Last menstrual cycle more than 2 yrs ago  SpO2 98%   BMI 34.39 kg/m    Physical Exam Gen: alert, oriented HEENT: perrla, eomi, mmm. Normal TM.  CV: rrr, no murmur Pulm: lctab. No wheeze or crackles.  GI: soft, nbs.  Nontender to palpation MSK: strength equal b/l. Normal gait Ext: no pedal edema Skin: warm and dry, no rashes Psych: pleasant affect.  Spontaneous speech   No results found for any visits on 05/12/24.      Assessment & Plan:   Physical exam, annual  Acquired hypothyroidism Assessment & Plan: History of hypothyroidism with recent TSH of 7. Reports inconsistent use in the past  but is now taking it daily. - Continue levothyroxine  175 mcg daily. - Recheck TSH in 6 months with lipid panel.   Other hyperlipidemia Assessment & Plan: LDL remains elevated at 189, consistent with previous levels (183 seven months ago). This level warrants treatment to reduce cardiovascular risk. Patient has a family history of statin intolerance. - Start Crestor  5 mg. - Discussed a slow  titration schedule to monitor for side effects: take once weekly for one month, then increase frequency monthly as tolerated. - Goal is daily use. - Recheck lipid panel in 6 months.   Class 1 obesity due to excess calories without serious comorbidity with body mass index (BMI) of 34.0 to 34.9 in adult Assessment & Plan:  Difficulty with weight loss, likely multifactorial including hypothyroidism and hormonal changes. - Discussed medication options including GLP-1 agonists (e.g., Ozempic), bupropion, Topamax, and phentermine. - Counseled on common GI side effects of GLP-1 agonists. - Advised to contact her insurance to check for coverage of weight loss medications. - Encouraged to use her [employer] benefit through insurance. - Recommended tracking food and water  intake for 1-2 weeks to establish a caloric baseline.Terri Lopez   Healthcare maintenance Assessment & Plan: - Mammogram was reportedly normal this year at [facility]. Will request records. - Last colonoscopy was a few years ago with Dr. Rollin, pt believes it was a 10 year f/u recommendation   Constipation, unspecified constipation type Assessment & Plan:  Persistent symptoms post-hysterectomy. Reports using Linzess as needed for relief. - Trial daily psyllium fiber (Metamucil) to improve bowel regularity. - Take at night to avoid interaction with levothyroxine . - Continue Linzess as needed for breakthrough symptoms.   Other orders -     ALPRAZolam ; Take 1 tablet (1 mg total) by mouth 3 (three) times daily as needed for anxiety.  Dispense: 90 tablet; Refill: 0 -     Levothyroxine  Sodium; Take 1 tablet (175 mcg total) by mouth daily before breakfast.  Dispense: 90 tablet; Refill: 3  URI/Allergic Rhinitis: Symptoms of sneezing, cough, and otalgia are likely viral or allergic in nature, exacerbated by nasal dryness. Exam is unremarkable. - Continue Flonase, increase frequency. - Use Vaseline in nostrils for dryness. -  Discussed alternative steroid nasal sprays if Flonase is too irritating.   Return in about 6 months (around 11/10/2024) for hld.    Toribio Terri Slain, MD

## 2024-05-12 NOTE — Patient Instructions (Signed)
 It was nice to see you today,  We addressed the following topics today: - I will send in refills for xanax  and thyroid  - You can try taking psyllium fiber (like Metamucil) daily at nighttime to help with the abdominal discomfort. - For the nasal dryness, you can apply a small amount of Vaseline to the inside of your nostrils. - Please start the Crestor  by taking it just once a week for the first month. If you tolerate it, you can increase to twice a week for the next month, and so on, building up to taking it daily. - We will recheck your cholesterol and thyroid  levels in about six months. - Track your diet for one to two weeks using an app like [employer] to get a baseline understanding of your calorie intake. Don't change anything at first, just log what you eat and drink.  Have a great day,  Rolan Slain, MD

## 2024-05-15 DIAGNOSIS — K5900 Constipation, unspecified: Secondary | ICD-10-CM | POA: Insufficient documentation

## 2024-05-15 DIAGNOSIS — Z0000 Encounter for general adult medical examination without abnormal findings: Secondary | ICD-10-CM | POA: Insufficient documentation

## 2024-05-15 NOTE — Assessment & Plan Note (Signed)
 Persistent symptoms post-hysterectomy. Reports using Linzess as needed for relief. - Trial daily psyllium fiber (Metamucil) to improve bowel regularity. - Take at night to avoid interaction with levothyroxine . - Continue Linzess as needed for breakthrough symptoms.

## 2024-05-15 NOTE — Assessment & Plan Note (Signed)
 Difficulty with weight loss, likely multifactorial including hypothyroidism and hormonal changes. - Discussed medication options including GLP-1 agonists (e.g., Ozempic), bupropion, Topamax, and phentermine. - Counseled on common GI side effects of GLP-1 agonists. - Advised to contact her insurance to check for coverage of weight loss medications. - Encouraged to use her No[employer] benefit through insurance. - Recommended tracking food and water  intake for 1-2 weeks to establish a caloric baseline.SABRA

## 2024-05-15 NOTE — Assessment & Plan Note (Signed)
 LDL remains elevated at 189, consistent with previous levels (183 seven months ago). This level warrants treatment to reduce cardiovascular risk. Patient has a family history of statin intolerance. - Start Crestor  5 mg. - Discussed a slow titration schedule to monitor for side effects: take once weekly for one month, then increase frequency monthly as tolerated. - Goal is daily use. - Recheck lipid panel in 6 months.

## 2024-05-15 NOTE — Assessment & Plan Note (Signed)
 History of hypothyroidism with recent TSH of 7. Reports inconsistent use in the past but is now taking it daily. - Continue levothyroxine  175 mcg daily. - Recheck TSH in 6 months with lipid panel.

## 2024-05-15 NOTE — Assessment & Plan Note (Signed)
-   Mammogram was reportedly normal this year at [facility]. Will request records. - Last colonoscopy was a few years ago with Dr. Rollin, pt believes it was a 10 year f/u recommendation

## 2024-07-08 ENCOUNTER — Other Ambulatory Visit: Payer: Self-pay | Admitting: Family Medicine

## 2024-10-27 ENCOUNTER — Other Ambulatory Visit: Payer: PRIVATE HEALTH INSURANCE

## 2024-11-03 ENCOUNTER — Ambulatory Visit: Payer: PRIVATE HEALTH INSURANCE | Admitting: Family Medicine
